# Patient Record
Sex: Female | Born: 1985 | Race: White | Hispanic: No | Marital: Single | Smoking: Current every day smoker
Health system: Southern US, Community
[De-identification: ages and names within clinical notes are randomized; demographics above are authoritative.]

## PROBLEM LIST (undated history)

## (undated) DIAGNOSIS — F4310 Post-traumatic stress disorder, unspecified: Secondary | ICD-10-CM

## (undated) DIAGNOSIS — F419 Anxiety disorder, unspecified: Secondary | ICD-10-CM

## (undated) HISTORY — PX: TONSILLECTOMY: SUR1361

## (undated) HISTORY — PX: BREAST ENHANCEMENT SURGERY: SHX7

## (undated) HISTORY — DX: Anxiety disorder, unspecified: F41.9

## (undated) HISTORY — DX: Post-traumatic stress disorder, unspecified: F43.10

---

## 2004-11-29 ENCOUNTER — Encounter: Payer: Self-pay | Admitting: Emergency Medicine

## 2004-11-29 ENCOUNTER — Observation Stay (HOSPITAL_COMMUNITY): Admission: AD | Admit: 2004-11-29 | Discharge: 2004-11-30 | Payer: Self-pay | Admitting: *Deleted

## 2006-10-09 IMAGING — CT CT ABDOMEN W/O CM
1 of 2 series · 15 of 32 positions shown, 19 images · IV contrast (agent unspecified)
Comparison: none

CLINICAL DATA: Severe lower abdominal and pelvic pain. Polycystic ovarian syndrome.  Hematuria.  Evaluate for urinary calculi or obstruction.
ABDOMEN CT WITHOUT CONTRAST:
TECHNIQUE: Multidetector CT imaging of the abdomen was performed following the standard protocol without IV contrast.  
There is no evidence of renal calculi or hydronephrosis.  Both kidneys are normal in contour and there is no evidence of perinephric fluid or inflammatory changes. 
Noncontrast images of the abdominal parenchymal organs are unremarkable.  The gallbladder is also unremarkable in appearance.  There is no evidence of inflammatory process or abnormal fluid collections.  There are shotty small less than 1 cm mesenteric lymph nodes noted which is a nonspecific finding and could be related to mesenteric adenitis.
TECHNIQUE: Multidetector CT imaging of the pelvis was performed following the standard protocol without IV contrast. 
There is no evidence of ureteral calculi or dilatation.  A Foley catheter is seen within the bladder which is decompressed.  The uterus is normal in size.  There is no evidence of pelvic masses or inflammatory process.  No abnormal fluid collections are seen.  The unopacified bowel loops are unremarkable in appearance.

[Series 2: abd pelvis · axial · 0.77mm/px · z∈[-470,-70]mm · 15 of 88 slices shown, 19 images]
[im 4/88  soft-tissue]
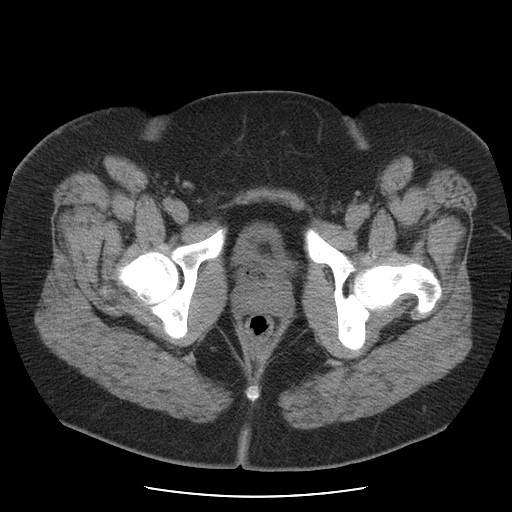
[im 4/88  bone]
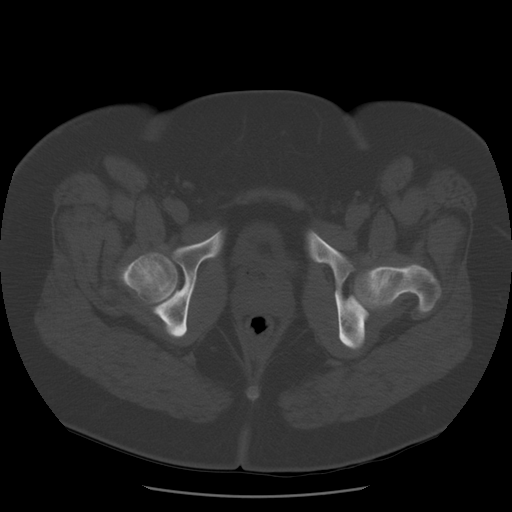
[im 11/88  soft-tissue]
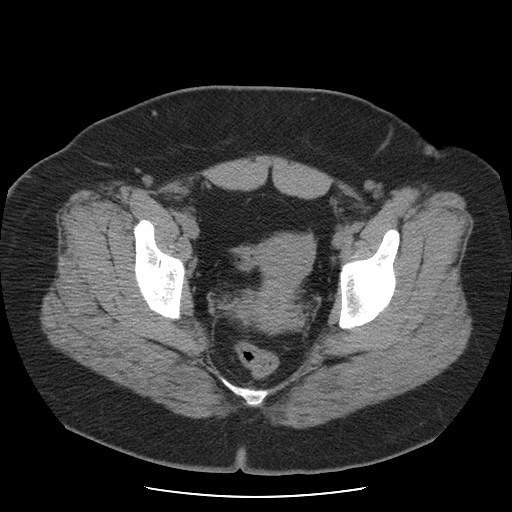
[im 17/88  soft-tissue]
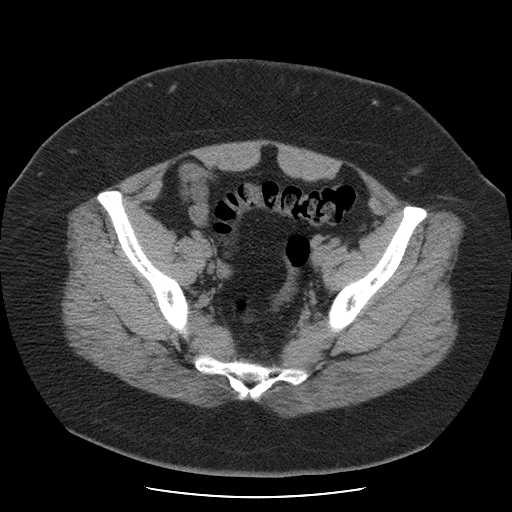
[im 24/88  soft-tissue]
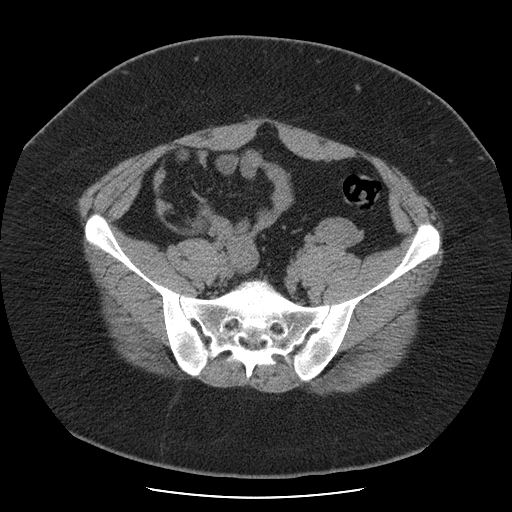
[im 31/88  soft-tissue]
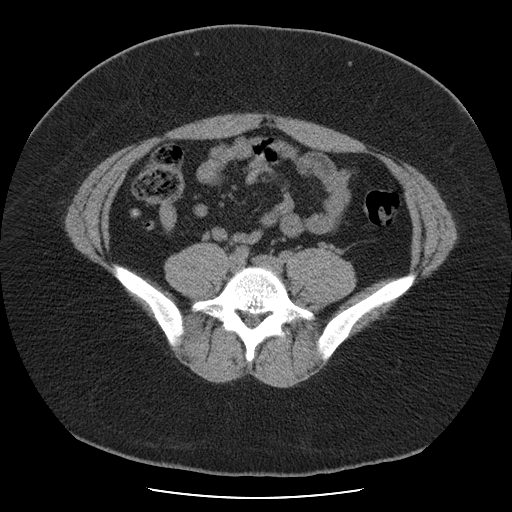
[im 37/88  soft-tissue]
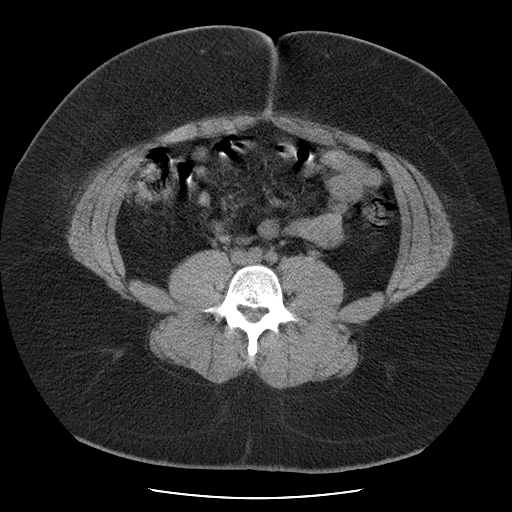
[im 44/88  soft-tissue]
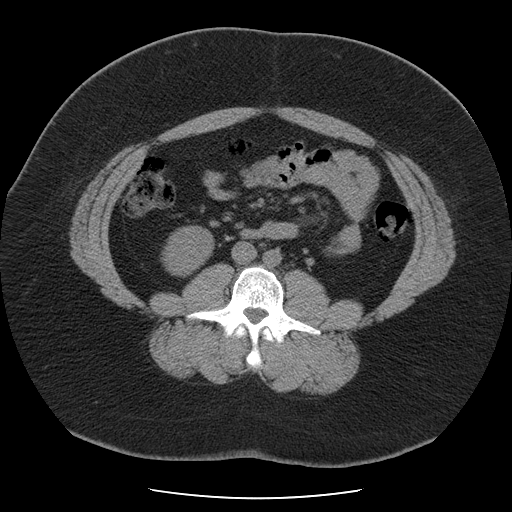
[im 51/88  soft-tissue]
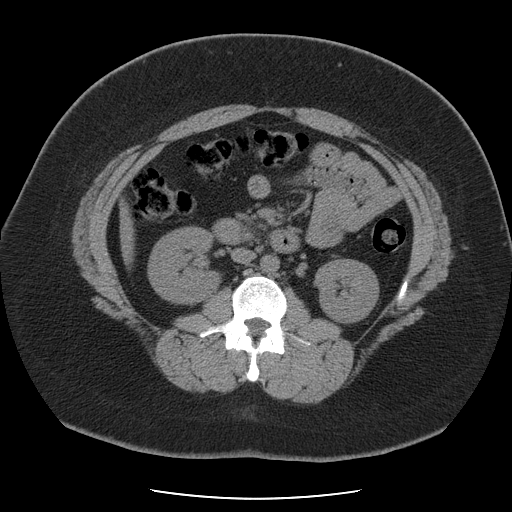
[im 57/88  soft-tissue]
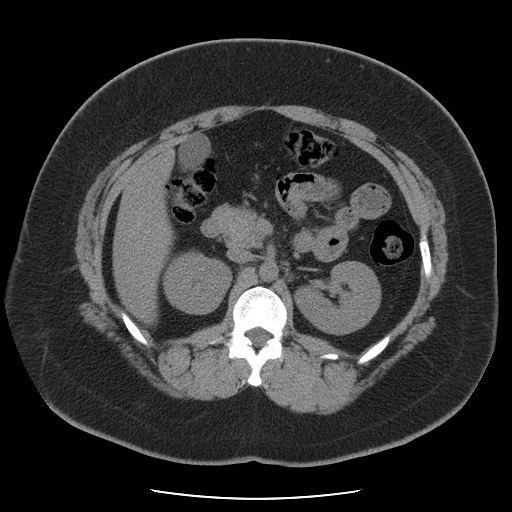
[im 57/88  bone]
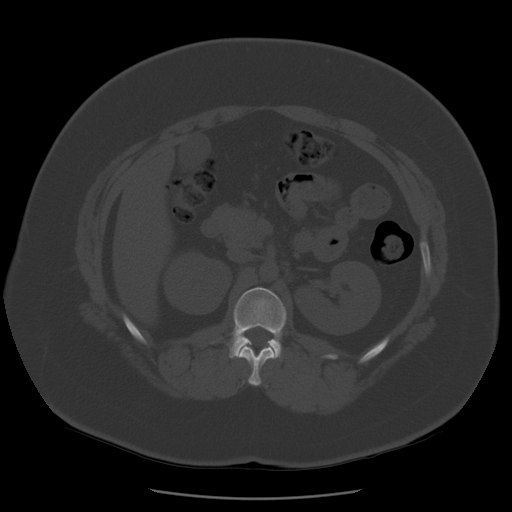
[im 64/88  soft-tissue]
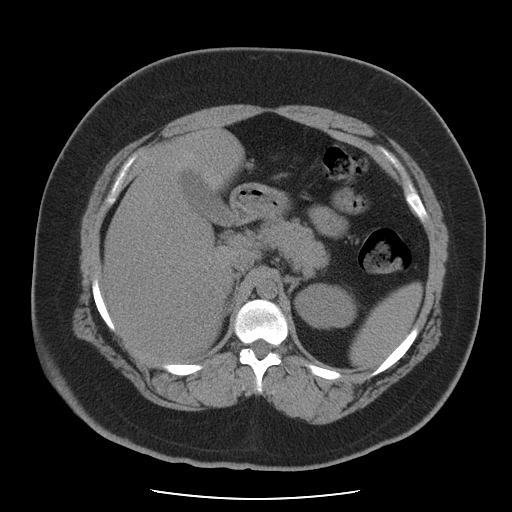
[im 71/88  soft-tissue]
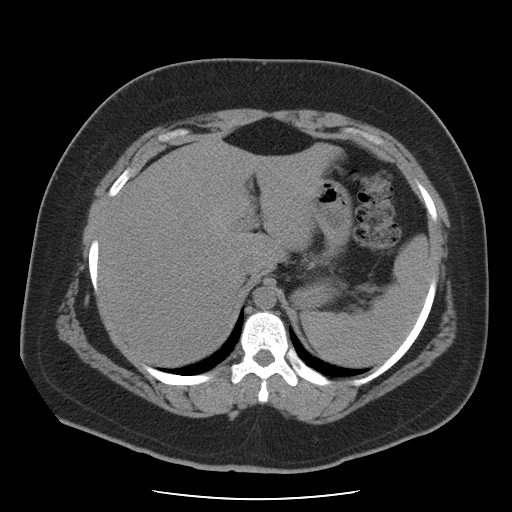
[im 74/88  lung]
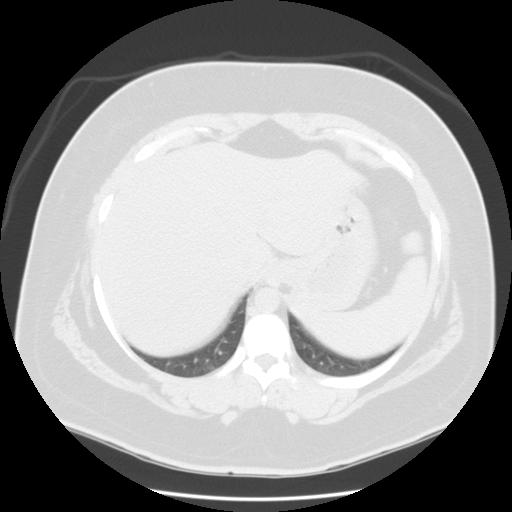
[im 77/88  soft-tissue]
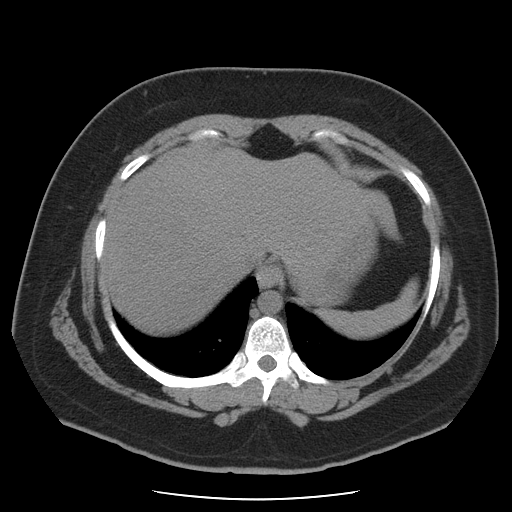
[im 77/88  lung]
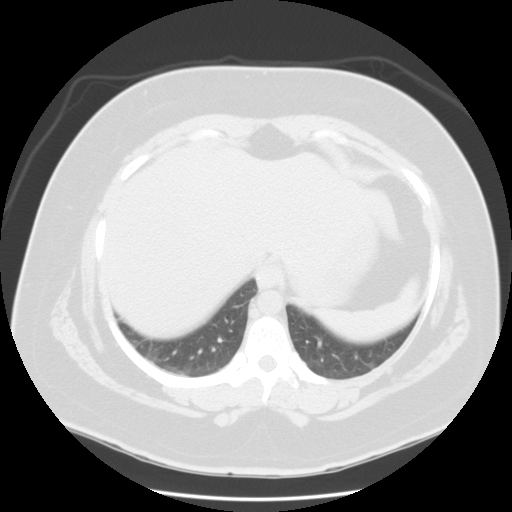
[im 81/88  lung]
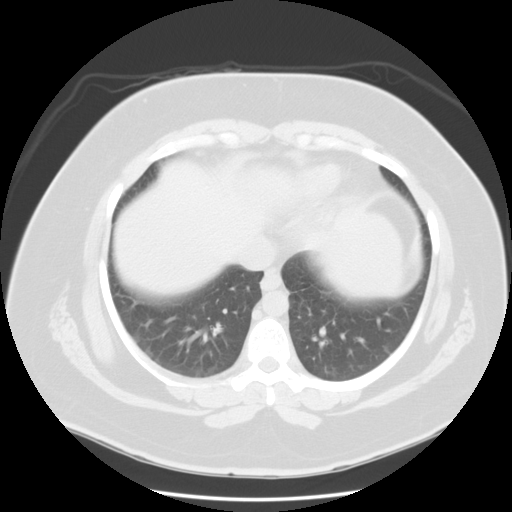
[im 84/88  soft-tissue]
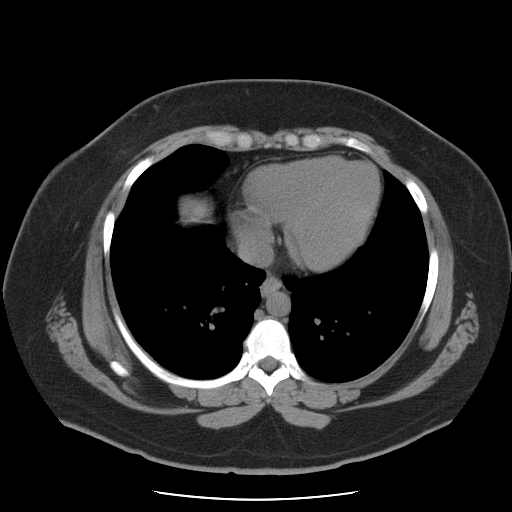
[im 84/88  lung]
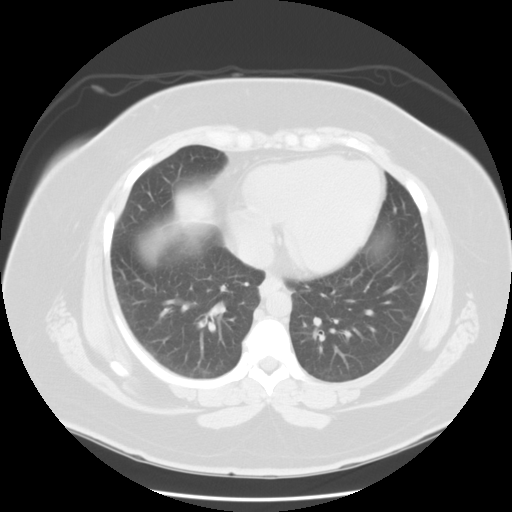

[15 of 32 positions shown; findings below may reference images not displayed]

IMPRESSION: 1.  No evidence of renal calculi or hydronephrosis.
2.  Shotty, small less than 1 cm, mesenteric lymph nodes noted.  This is a nonspecific finding, and may be seen with mesenteric adenitis/gastroenteritis.  No inflammatory process or abnormal fluid collections identified.  
PELVIS CT WITHOUT CONTRAST:
IMPRESSION: No acute findings.  No evidence of ureteral calculi or dilatation.

## 2007-12-29 ENCOUNTER — Ambulatory Visit: Payer: Self-pay | Admitting: Family Medicine

## 2010-03-02 ENCOUNTER — Emergency Department: Payer: Self-pay | Admitting: Emergency Medicine

## 2010-09-30 ENCOUNTER — Emergency Department: Payer: Self-pay | Admitting: Unknown Physician Specialty

## 2010-10-09 ENCOUNTER — Emergency Department: Payer: Self-pay | Admitting: Emergency Medicine

## 2010-10-12 ENCOUNTER — Emergency Department: Payer: Self-pay | Admitting: Emergency Medicine

## 2010-10-13 ENCOUNTER — Emergency Department (HOSPITAL_COMMUNITY)
Admission: EM | Admit: 2010-10-13 | Discharge: 2010-10-13 | Disposition: A | Discharge status: Home / Self Care | Payer: Self-pay | Attending: Emergency Medicine | Admitting: Emergency Medicine

## 2010-10-13 ENCOUNTER — Emergency Department (HOSPITAL_COMMUNITY): Payer: Self-pay

## 2010-10-13 DIAGNOSIS — R05 Cough: Secondary | ICD-10-CM | POA: Insufficient documentation

## 2010-10-13 DIAGNOSIS — N390 Urinary tract infection, site not specified: Secondary | ICD-10-CM | POA: Insufficient documentation

## 2010-10-13 DIAGNOSIS — Z79899 Other long term (current) drug therapy: Secondary | ICD-10-CM | POA: Insufficient documentation

## 2010-10-13 DIAGNOSIS — R509 Fever, unspecified: Secondary | ICD-10-CM | POA: Insufficient documentation

## 2010-10-13 DIAGNOSIS — R059 Cough, unspecified: Secondary | ICD-10-CM | POA: Insufficient documentation

## 2010-10-13 DIAGNOSIS — R112 Nausea with vomiting, unspecified: Secondary | ICD-10-CM | POA: Insufficient documentation

## 2010-10-13 DIAGNOSIS — R197 Diarrhea, unspecified: Secondary | ICD-10-CM | POA: Insufficient documentation

## 2010-10-13 LAB — URINALYSIS, ROUTINE W REFLEX MICROSCOPIC
Hgb urine dipstick: NEGATIVE
Leukocytes, UA: NEGATIVE
Nitrite: NEGATIVE
Protein, ur: NEGATIVE mg/dL
Specific Gravity, Urine: 1.024 (ref 1.005–1.030)
Urobilinogen, UA: 0.2 mg/dL (ref 0.0–1.0)

## 2010-10-13 LAB — CBC
MCV: 88.2 fL (ref 78.0–100.0)
Platelets: 191 10*3/uL (ref 150–400)
RBC: 4.4 MIL/uL (ref 3.87–5.11)
RDW: 13.4 % (ref 11.5–15.5)
WBC: 9.2 10*3/uL (ref 4.0–10.5)

## 2010-10-13 LAB — POCT PREGNANCY, URINE: Preg Test, Ur: NEGATIVE

## 2010-10-13 LAB — BASIC METABOLIC PANEL
Calcium: 8.6 mg/dL (ref 8.4–10.5)
Chloride: 100 mEq/L (ref 96–112)
Creatinine, Ser: 1.01 mg/dL (ref 0.50–1.10)
GFR calc Af Amer: >60 mL/min (ref >60)
Sodium: 135 mEq/L (ref 135–145)

## 2010-10-13 LAB — DIFFERENTIAL
Basophils Absolute: 0.2 10*3/uL — ABNORMAL HIGH (ref 0.0–0.1)
Basophils Relative: 2 % — ABNORMAL HIGH (ref 0–1)
Eosinophils Absolute: 0.1 10*3/uL (ref 0.0–0.7)
Lymphocytes Relative: 51 % — ABNORMAL HIGH (ref 12–46)
Monocytes Absolute: 0.4 10*3/uL (ref 0.1–1.0)
Neutrophils Relative %: 42 % — ABNORMAL LOW (ref 43–77)

## 2010-10-13 LAB — PATHOLOGIST SMEAR REVIEW: Tech Review: REACTIVE

## 2010-10-13 LAB — MONONUCLEOSIS SCREEN: Mono Screen: NEGATIVE

## 2010-10-13 MED ORDER — IOHEXOL 300 MG/ML  SOLN
80.0000 mL | Freq: Once | INTRAMUSCULAR | Status: AC | PRN
  Administered 2010-10-13: 80 mL via INTRAVENOUS

## 2010-10-14 LAB — URINE CULTURE

## 2010-10-19 LAB — CULTURE, BLOOD (ROUTINE X 2): Culture: NO GROWTH

## 2011-04-15 ENCOUNTER — Emergency Department: Payer: Self-pay | Admitting: Emergency Medicine

## 2011-04-15 LAB — CBC
HCT: 41 %
HGB: 14.1 g/dL
MCH: 31.1 pg
MCHC: 34.4 g/dL
MCV: 90 fL
Platelet: 262 10*3/uL
RBC: 4.54 X10 6/mm 3
RDW: 13.1 %
WBC: 7.9 10*3/uL

## 2011-04-15 LAB — COMPREHENSIVE METABOLIC PANEL
Albumin: 4 g/dL (ref 3.4–5.0)
BUN: 18 mg/dL (ref 7–18)
Creatinine: 0.81 mg/dL (ref 0.60–1.30)
SGPT (ALT): 46 U/L
Total Protein: 7.3 g/dL (ref 6.4–8.2)

## 2011-04-16 LAB — URINALYSIS, COMPLETE
Bilirubin,UR: NEGATIVE
Ketone: NEGATIVE
Ph: 5 (ref 4.5–8.0)
Protein: 100
Specific Gravity: 1.025 (ref 1.003–1.030)
Squamous Epithelial: <1
WBC UR: 8 /HPF (ref 0–5)

## 2011-09-09 ENCOUNTER — Emergency Department: Payer: Self-pay | Admitting: Emergency Medicine

## 2012-08-10 IMAGING — CT CT ABD-PELV W/O CM
1 of 2 series · 15 of 32 positions shown, 19 images · non-contrast
Comparison: None

REASON FOR EXAM: (1) flank [pain; (2) flank ppain
COMMENTS:   May transport without cardiac monitor

PROCEDURE:     CT  - CT ABDOMEN AND PELVIS W[DATE]  [DATE]
RESULT:     Indication: flank pain
TECHNIQUE: Multiple axial images from the lung bases to the symphysis pubis
were obtained without oral and without intravenous contrast.

[Series 4: soft tissue · axial · 0.78mm/px · z∈[-1190,-734]mm · 15 of 166 slices shown, 19 images]
[im 7/166  soft-tissue]
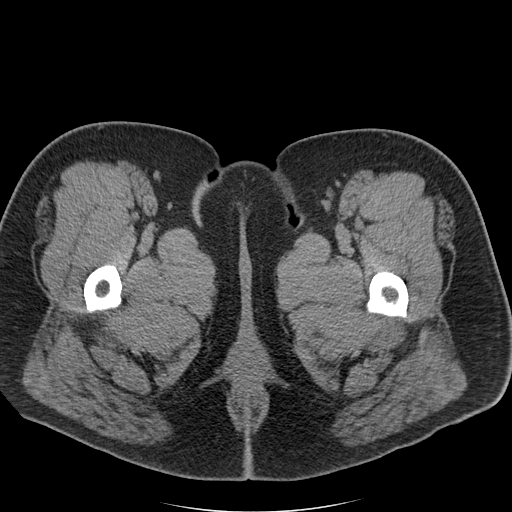
[im 7/166  bone]
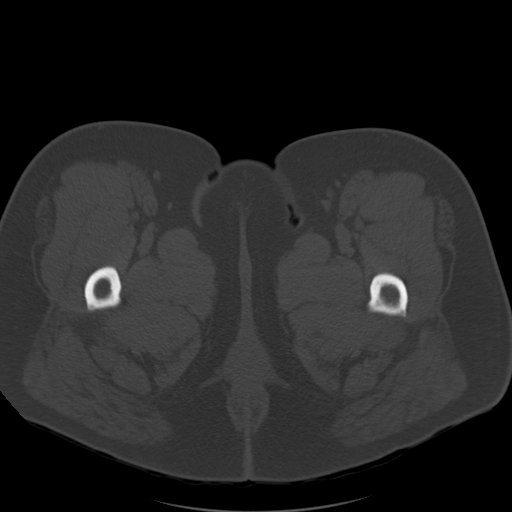
[im 20/166  soft-tissue]
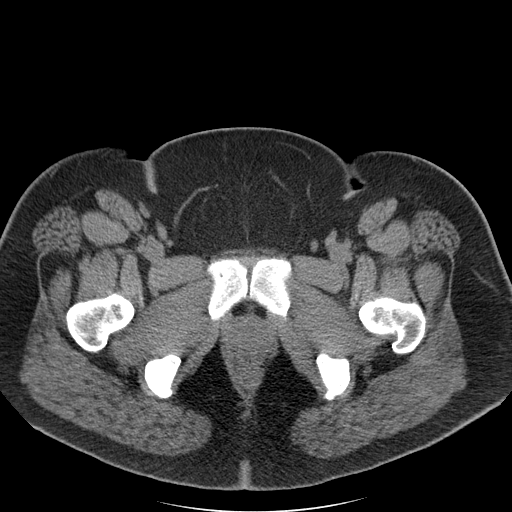
[im 32/166  soft-tissue]
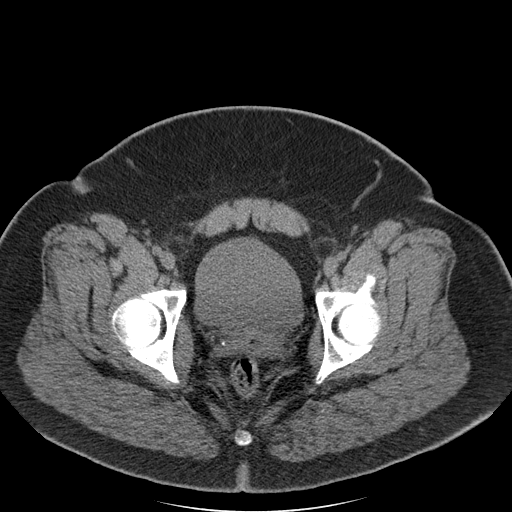
[im 45/166  soft-tissue]
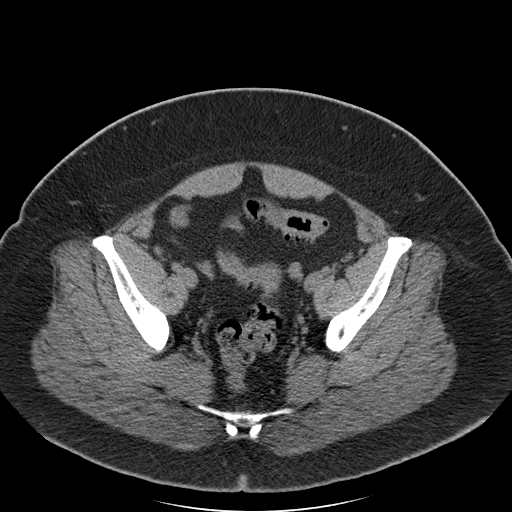
[im 58/166  soft-tissue]
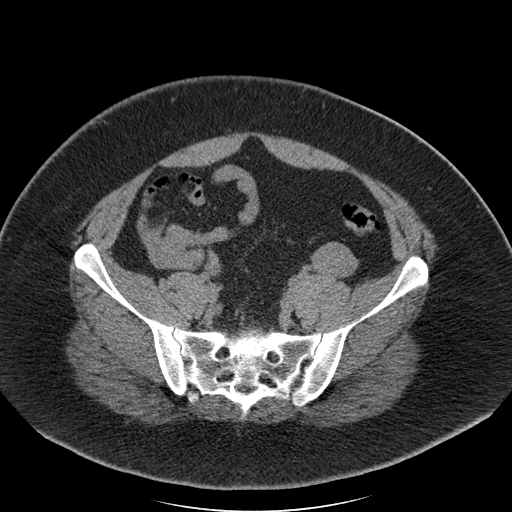
[im 70/166  soft-tissue]
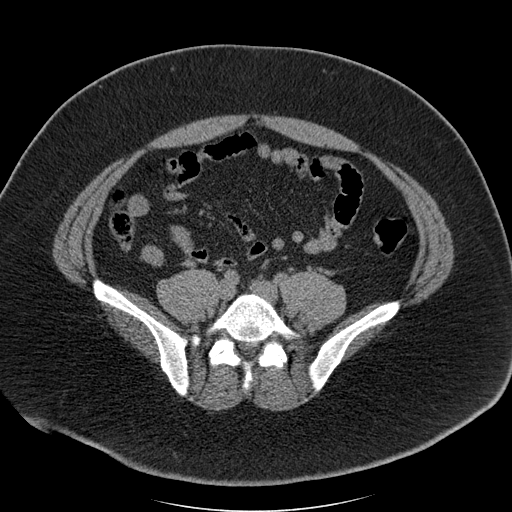
[im 83/166  soft-tissue]
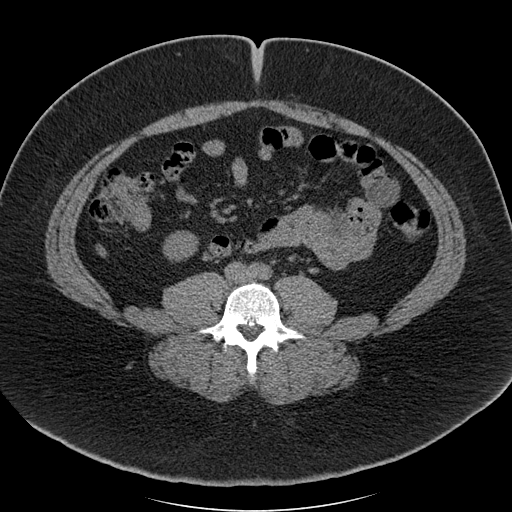
[im 96/166  soft-tissue]
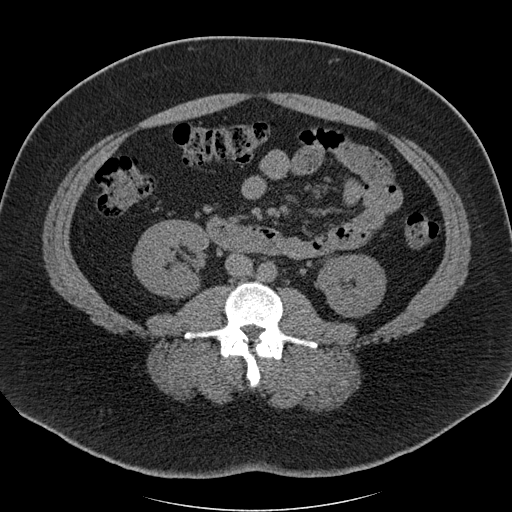
[im 108/166  soft-tissue]
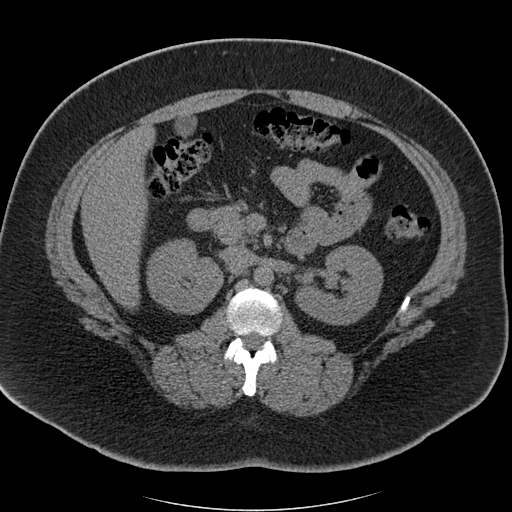
[im 108/166  bone]
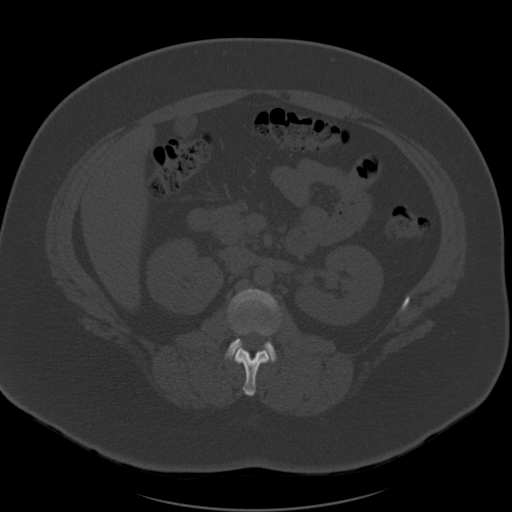
[im 121/166  soft-tissue]
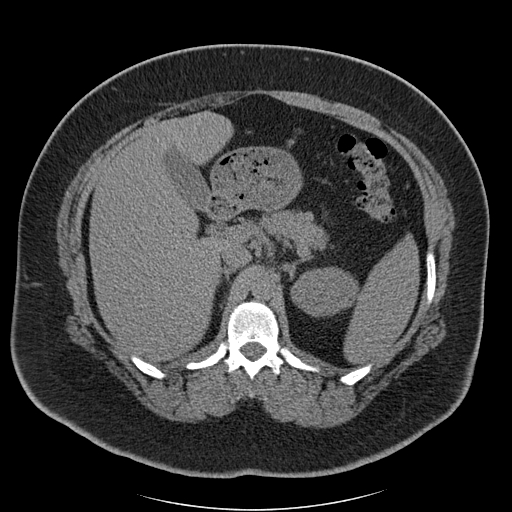
[im 134/166  soft-tissue]
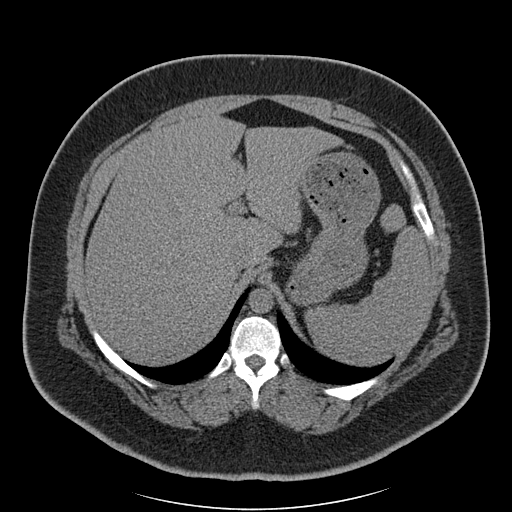
[im 140/166  lung]
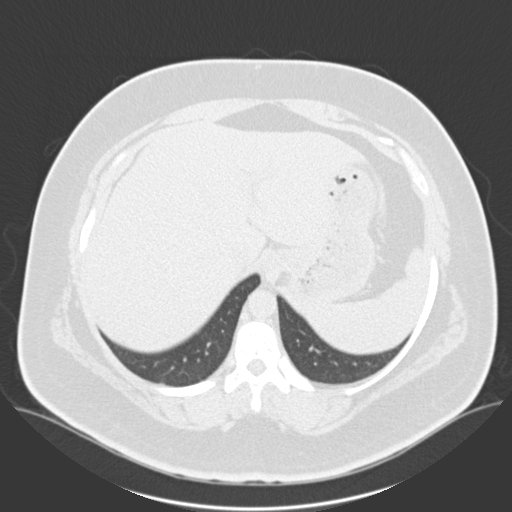
[im 146/166  soft-tissue]
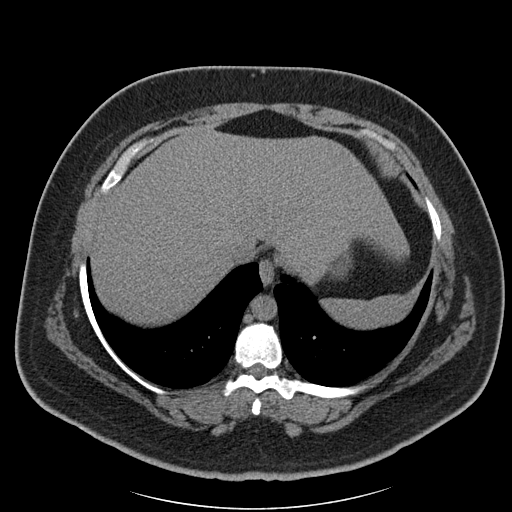
[im 146/166  lung]
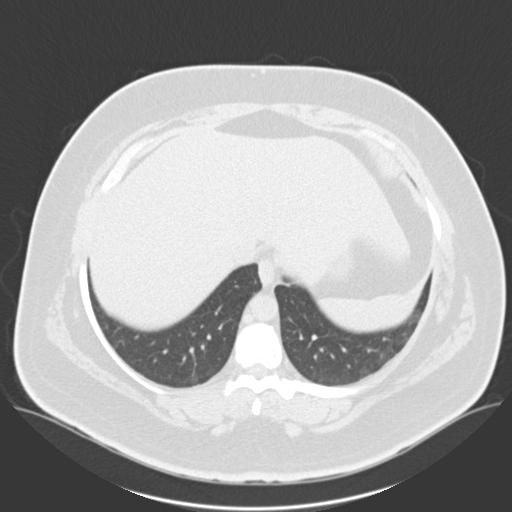
[im 153/166  lung]
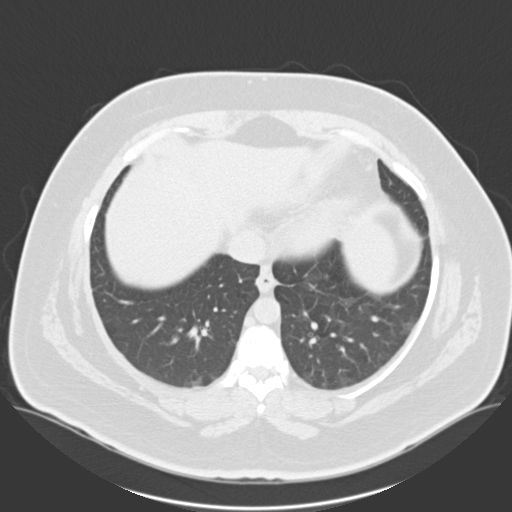
[im 159/166  soft-tissue]
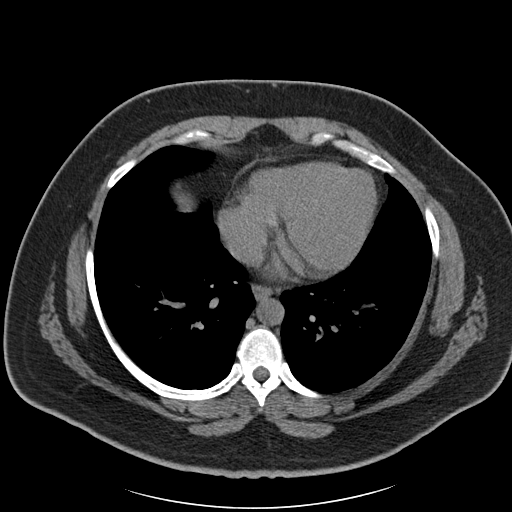
[im 159/166  lung]
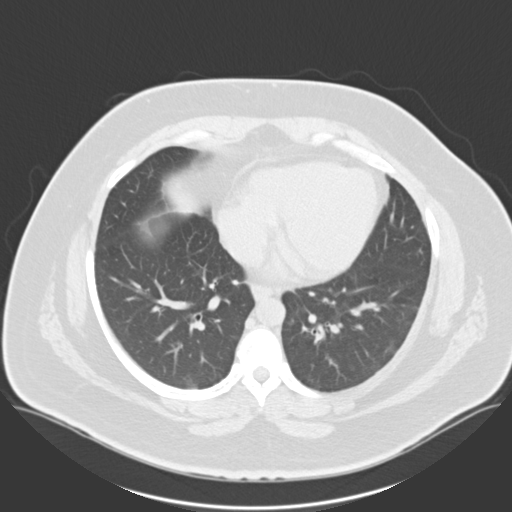

[15 of 32 positions shown; findings below may reference images not displayed]

FINDINGS: The lung bases are clear. There is no pleural or pericardial effusions.

No renal, ureteral, or bladder calculi. No obstructive uropathy. No
perinephric stranding is seen. The kidneys are symmetric in size without
evidence for exophytic mass. The bladder is unremarkable.

The liver demonstrates no focal abnormality. The gallbladder is
unremarkable. The spleen demonstrates no focal abnormality. The adrenal
glands and pancreas are normal.

The unopacified stomach, duodenum, small intestine, and large intestine are
unremarkable, but evaluation is limited by lack of oral contrast. There is a
normal caliber appendix in the right lower quadrant without periappendiceal
inflammatory changes. There is no pneumoperitoneum, pneumatosis, or portal
venous gas. There is no abdominal or pelvic free fluid. There is no
lymphadenopathy.

The abdominal aorta is normal in caliber .

The osseous structures are unremarkable.
IMPRESSION: 1. No urolithiasis or obstructive uropathy.

## 2012-08-18 IMAGING — US US PELV - US TRANSVAGINAL
1 series · 17 of 25 positions shown · non-contrast
Comparison: none

REASON FOR EXAM: pelvic pain and fever
COMMENTS:

PROCEDURE:     US  - US PELVIS EXAM W/TRANSVAGINAL  - October 09, 2010  [DATE]
RESULT:     Comparison: None.
TECHNIQUE: Multiple grayscale and color Doppler images obtained of the
pelvis by transabdominal ultrasound and endovaginal ultrasound.

[Series 1: us pelv - us transvaginal · 17 of 44 slices shown]
[im 1/44]
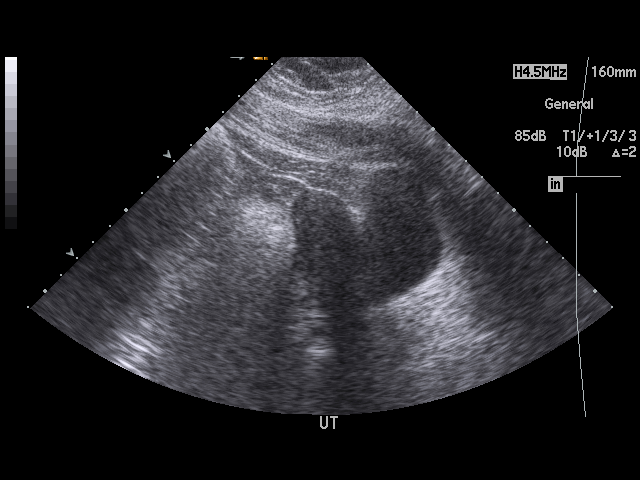
[im 4/44]
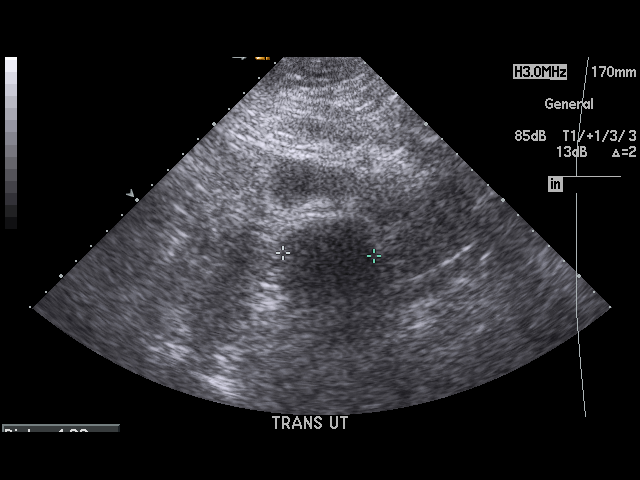
[im 6/44]
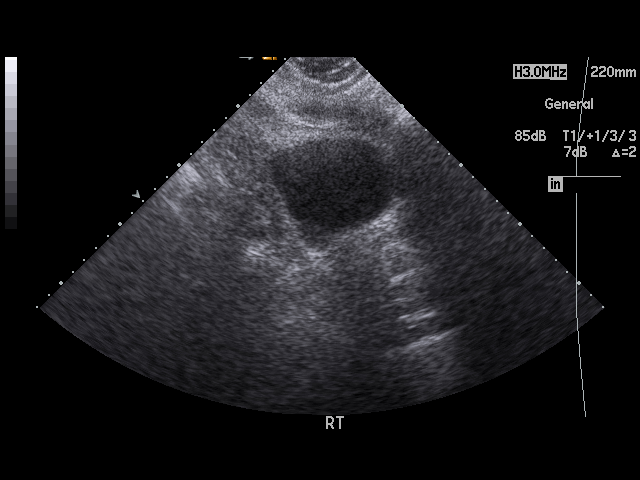
[im 9/44]
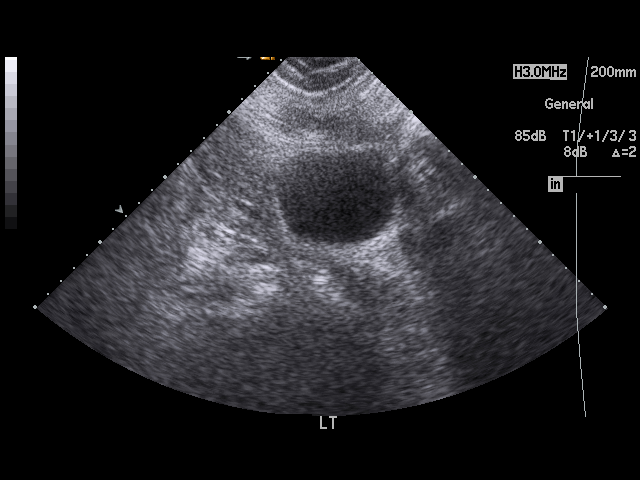
[im 11/44]
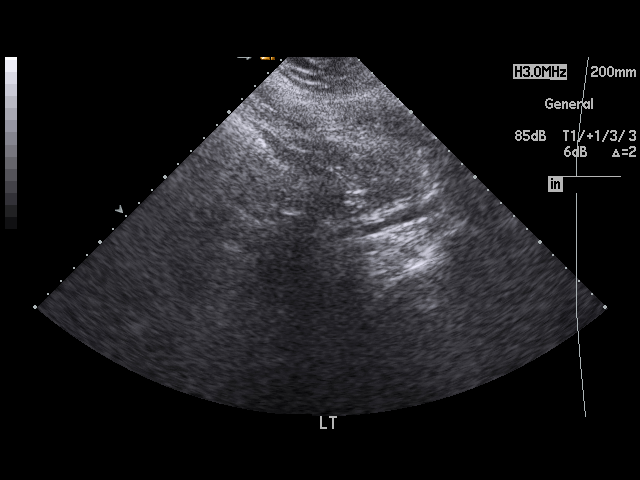
[im 15/44]
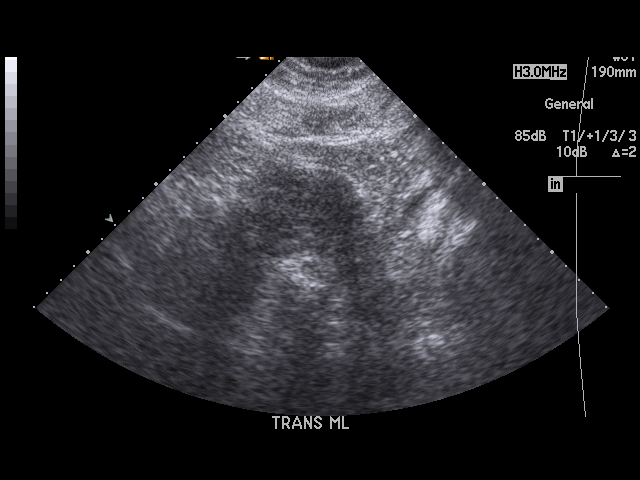
[im 17/44]
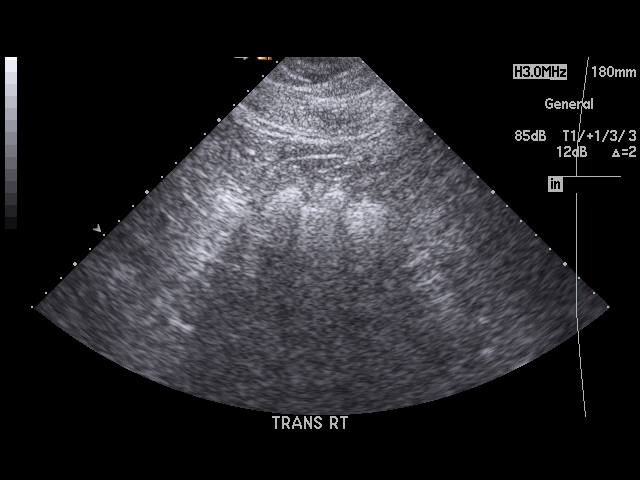
[im 20/44]
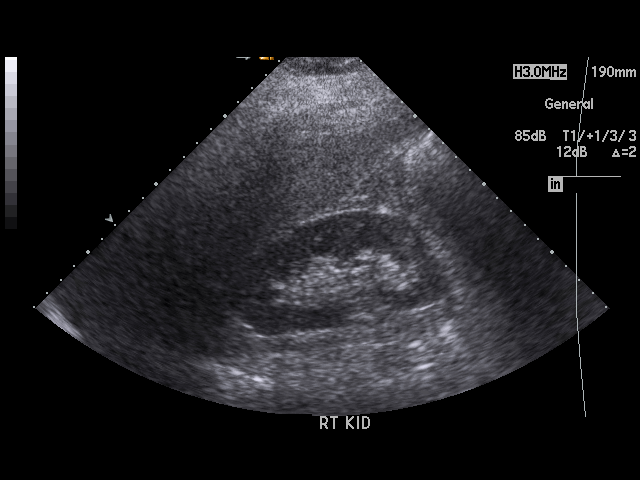
[im 22/44]
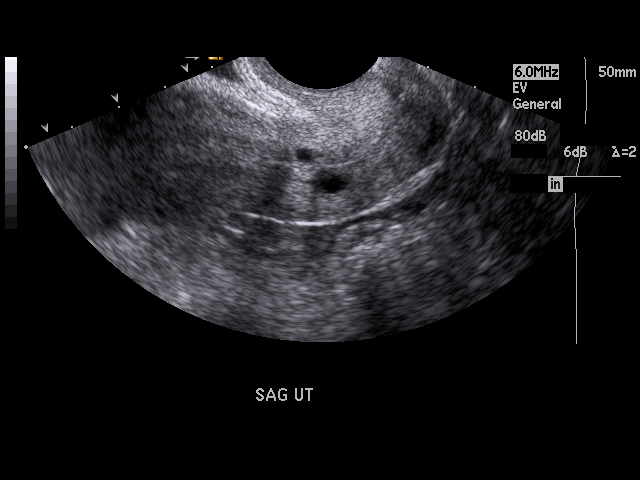
[im 24/44]
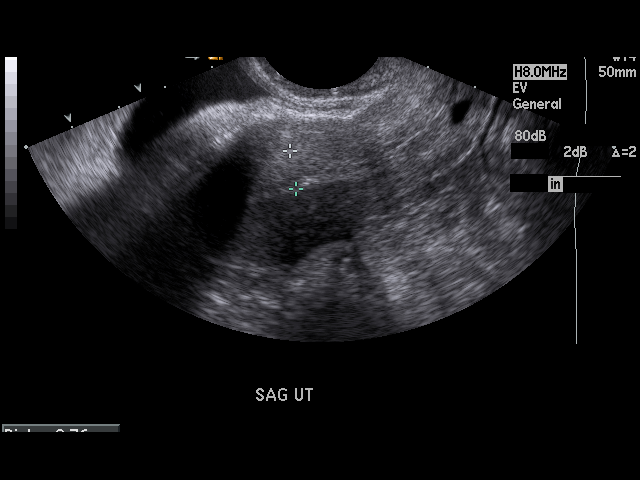
[im 27/44]
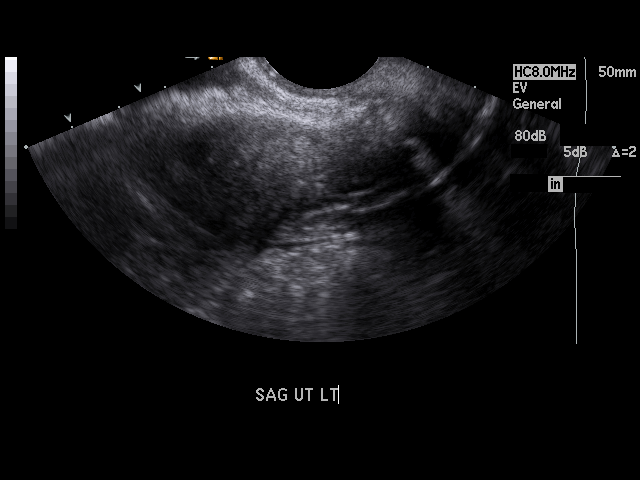
[im 29/44]
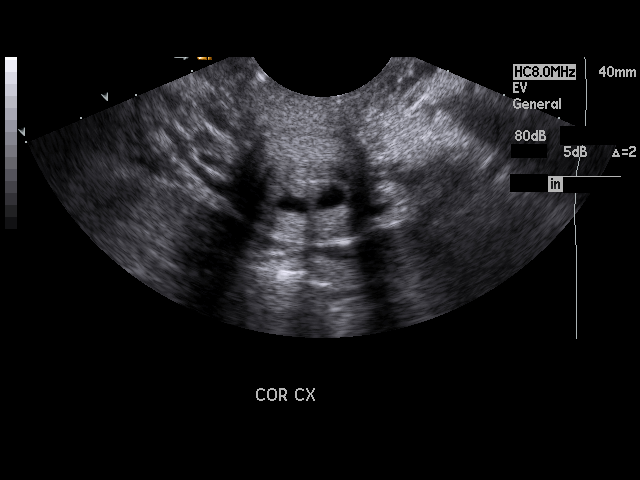
[im 33/44]
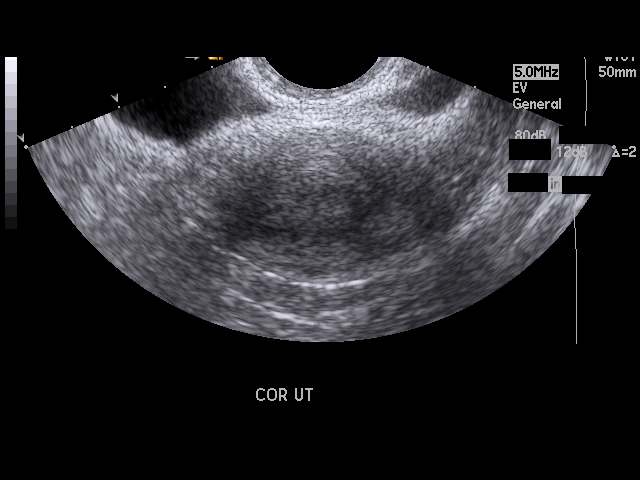
[im 35/44]
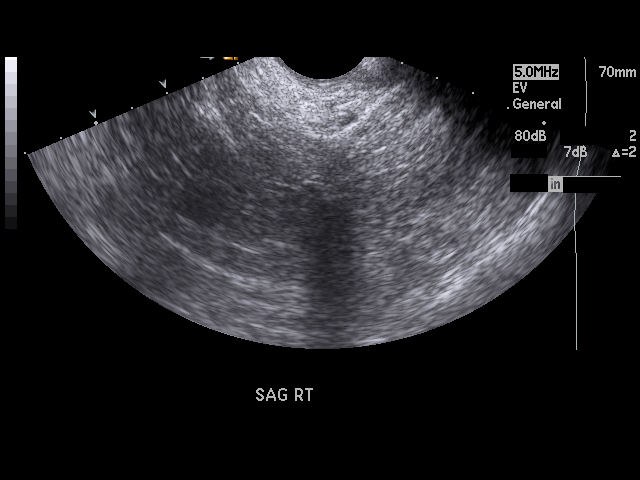
[im 38/44]
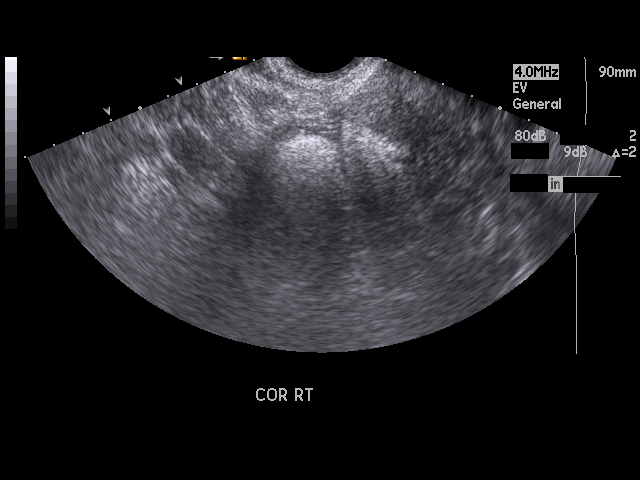
[im 40/44]
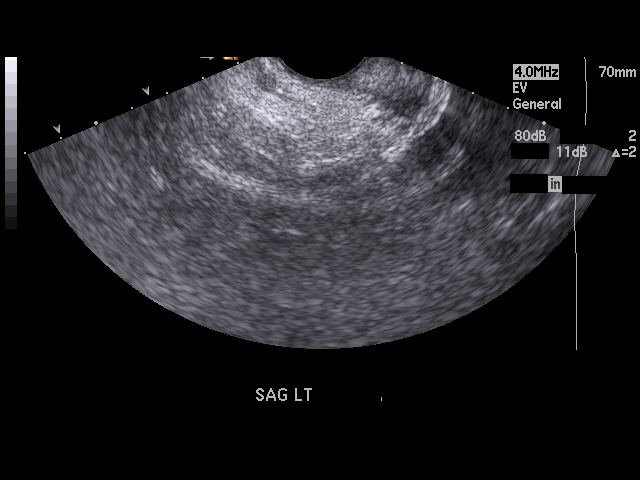
[im 44/44]
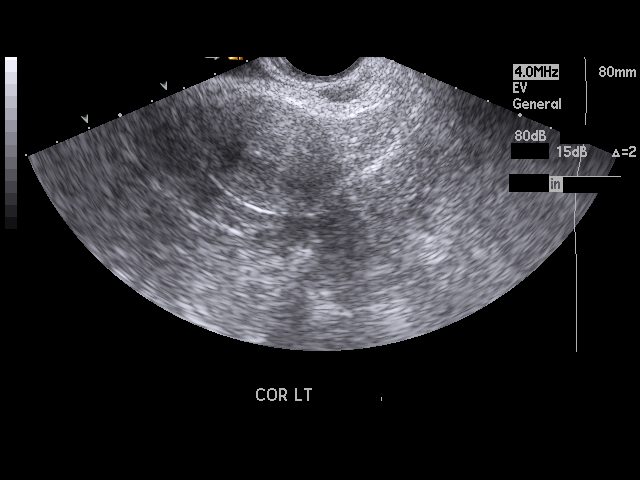

[17 of 25 positions shown; findings below may reference images not displayed]

FINDINGS: The uterus measures 8.2 x 4.2 x 2.8 cm. The endometrial stripe measures 8
mm. The ovaries were unable to be visualized. No adnexal mass identified.
Small nabothian cysts in the cervix.
IMPRESSION: 1. Unremarkable uterus.
2. Ovaries were unable to be visualized.

## 2013-06-25 ENCOUNTER — Emergency Department: Payer: Self-pay | Admitting: Emergency Medicine

## 2013-12-01 ENCOUNTER — Ambulatory Visit: Payer: Self-pay | Admitting: Family Medicine

## 2013-12-08 ENCOUNTER — Ambulatory Visit: Payer: Self-pay | Admitting: Family Medicine

## 2013-12-15 ENCOUNTER — Ambulatory Visit: Payer: Self-pay | Admitting: Family Medicine

## 2013-12-24 ENCOUNTER — Ambulatory Visit: Payer: Self-pay | Admitting: Emergency Medicine

## 2014-01-12 ENCOUNTER — Emergency Department: Payer: Self-pay | Admitting: Emergency Medicine

## 2015-05-02 ENCOUNTER — Encounter: Payer: Self-pay | Admitting: Family Medicine

## 2015-05-02 ENCOUNTER — Ambulatory Visit (INDEPENDENT_AMBULATORY_CARE_PROVIDER_SITE_OTHER): Payer: BLUE CROSS/BLUE SHIELD | Admitting: Family Medicine

## 2015-05-02 VITALS — BP 120/80 | HR 80 | Temp 97.8°F | Ht 63.0 in | Wt 257.0 lb

## 2015-05-02 DIAGNOSIS — J40 Bronchitis, not specified as acute or chronic: Secondary | ICD-10-CM

## 2015-05-02 DIAGNOSIS — J0100 Acute maxillary sinusitis, unspecified: Secondary | ICD-10-CM

## 2015-05-02 LAB — POCT INFLUENZA A/B
INFLUENZA B, POC: NEGATIVE
Influenza A, POC: NEGATIVE

## 2015-05-02 MED ORDER — DOXYCYCLINE HYCLATE 100 MG PO TABS: 100.0000 mg | ORAL_TABLET | Freq: Two times a day (BID) | ORAL | Status: DC

## 2015-05-02 MED ORDER — GUAIFENESIN-CODEINE 100-10 MG/5ML PO SYRP: 5.0000 mL | ORAL_SOLUTION | Freq: Three times a day (TID) | ORAL | Status: DC | PRN

## 2015-05-02 NOTE — Progress Notes (Signed)
Name: Angela Lester   MRN: [id number]    DOB: 1985-07-31   Date:05/02/2015       Progress Note  Subjective  Chief Complaint  Chief Complaint  Patient presents with  . Establish Care  . Sinusitis    cough and cong x 3 weeks- tried Theraflu, Sudafed and Flonase    Sinusitis This is a new problem. The current episode started 1 to 4 weeks ago. The problem has been gradually worsening since onset. The maximum temperature recorded prior to her arrival was 100.4 - 100.9 F. Associated symptoms include chills, congestion, coughing, diaphoresis, ear pain, headaches, a hoarse voice, neck pain, shortness of breath, sinus pressure, sneezing, a sore throat and swollen glands. Past treatments include acetaminophen and oral decongestants. The treatment provided no relief.  Cough This is a new problem. The current episode started 1 to 4 weeks ago. The problem has been gradually worsening. The cough is productive of purulent sputum (yellow). Associated symptoms include chills, ear pain, headaches, a sore throat and shortness of breath. Pertinent negatives include no chest pain, fever, heartburn, myalgias, rash, weight loss or wheezing. The symptoms are aggravated by cold air. She has tried OTC cough suppressant for the symptoms. The treatment provided no relief. There is no history of environmental allergies.    No problem-specific assessment & plan notes found for this encounter.   Past Medical History  Diagnosis Date  . Anxiety   . Post traumatic stress disorder (PTSD)     Past Surgical History  Procedure Laterality Date  . Breast enhancement surgery    . Tonsillectomy      Family History  Problem Relation Age of Onset  . Cancer Mother   . Diabetes Father   . Hypertension Father   . Cancer Maternal Grandfather   . Cancer Paternal Grandmother     Social History   Social History  . Marital Status: Single    Spouse Name: N/A  . Number of Children: N/A  . Years of Education: N/A    Occupational History  . Not on file.   Social History Main Topics  . Smoking status: Former Games developer  . Smokeless tobacco: Not on file  . Alcohol Use: No  . Drug Use: No  . Sexual Activity: No   Other Topics Concern  . Not on file   Social History Narrative  . No narrative on file    Allergies  Allergen Reactions  . Amoxil [Amoxicillin]   . Codeine   . Vicodin [Hydrocodone-Acetaminophen]      Review of Systems  Constitutional: Positive for chills and diaphoresis. Negative for fever, weight loss and malaise/fatigue.  HENT: Positive for congestion, ear pain, hoarse voice, sinus pressure, sneezing and sore throat. Negative for ear discharge.   Eyes: Positive for blurred vision.  Respiratory: Positive for cough and shortness of breath. Negative for sputum production and wheezing.   Cardiovascular: Negative for chest pain, palpitations and leg swelling.  Gastrointestinal: Negative for heartburn, nausea, abdominal pain, diarrhea, constipation, blood in stool and melena.  Genitourinary: Negative for dysuria, urgency, frequency and hematuria.  Musculoskeletal: Positive for neck pain. Negative for myalgias, back pain and joint pain.  Skin: Negative for rash.  Neurological: Positive for headaches. Negative for dizziness, tingling, sensory change and focal weakness.  Endo/Heme/Allergies: Positive for polydipsia. Negative for environmental allergies. Does not bruise/bleed easily.  Psychiatric/Behavioral: Negative for depression and suicidal ideas. The patient is not nervous/anxious and does not have insomnia.      Objective  Filed Vitals:   05/02/15 1503  BP: 120/80  Pulse: 80  Temp: 97.8 F (36.6 C)  TempSrc: Oral  Height:  (1.6 m)  Weight: 257 lb (116.574 kg)    Physical Exam  Constitutional: She is well-developed, well-nourished, and in no distress. No distress.  HENT:  Head: Normocephalic and atraumatic.  Right Ear: External ear normal.  Left Ear: External  ear normal.  Nose: Nose normal.  Mouth/Throat: Oropharynx is clear and moist.  Eyes: Conjunctivae and EOM are normal. Pupils are equal, round, and reactive to light. Right eye exhibits no discharge. Left eye exhibits no discharge.  Neck: Normal range of motion. Neck supple. No JVD present. No thyromegaly present.  Cardiovascular: Normal rate, regular rhythm, normal heart sounds and intact distal pulses.  Exam reveals no gallop and no friction rub.   No murmur heard. Pulmonary/Chest: Effort normal and breath sounds normal.  Abdominal: Soft. Bowel sounds are normal. She exhibits no mass. There is no tenderness. There is no guarding.  Musculoskeletal: Normal range of motion. She exhibits no edema.  Lymphadenopathy:    She has no cervical adenopathy.  Neurological: She is alert.  Skin: Skin is warm and dry. She is not diaphoretic.  Psychiatric: Mood and affect normal.  Nursing note and vitals reviewed.     Assessment & Plan  Problem List Items Addressed This Visit    None    Visit Diagnoses    Acute maxillary sinusitis, recurrence not specified    -  Primary    Relevant Medications    loratadine (CLARITIN) 10 MG tablet    doxycycline (VIBRA-TABS) 100 MG tablet    guaiFENesin-codeine (ROBITUSSIN AC) 100-10 MG/5ML syrup    Bronchitis        pt says she can tolerate if takes benedryl prior    Relevant Medications    doxycycline (VIBRA-TABS) 100 MG tablet    guaiFENesin-codeine (ROBITUSSIN AC) 100-10 MG/5ML syrup    Other Relevant Orders    POCT Influenza A/B (Completed)         Dr. Hay[facility]  05/02/2015

## 2016-11-13 ENCOUNTER — Ambulatory Visit
Admission: EM | Admit: 2016-11-13 | Discharge: 2016-11-13 | Disposition: A | Discharge status: Home / Self Care | Payer: Self-pay | Attending: Family Medicine | Admitting: Family Medicine

## 2016-11-13 DIAGNOSIS — J069 Acute upper respiratory infection, unspecified: Secondary | ICD-10-CM

## 2016-11-13 MED ORDER — HYDROCOD POLST-CPM POLST ER 10-8 MG/5ML PO SUER: 5.0000 mL | Freq: Two times a day (BID) | ORAL | 0 refills | Status: DC

## 2016-11-13 NOTE — ED Provider Notes (Signed)
[facility]    CSN: [id number] Arrival date & time: 11/13/16  1840     History   Chief Complaint Chief Complaint  Patient presents with  . URI    HPI Angela Lester is a 31 y.o. female.   HPI  This a 31 year old female for the last 2 days has had cough productive of green to dark yellow sputum worsened by lying down otalgia rhinorrhea and headaches. Dates that she has a taste of infection from sinus in her mouth. He has some chest soreness from coughing so much. She has had sick contacts around her her husband who was diagnosed with bronchitis and her mother who accompanies her today and is also a patient. He has been afebrile.        Past Medical History:  Diagnosis Date  . Anxiety   . Post traumatic stress disorder (PTSD)     There are no active problems to display for this patient.   Past Surgical History:  Procedure Laterality Date  . BREAST ENHANCEMENT SURGERY    . TONSILLECTOMY      OB History    No data available       Home Medications    Prior to Admission medications   Medication Sig Start Date End Date Taking? Authorizing Provider  fexofenadine-pseudoephedrine (ALLEGRA-D 24) 180-240 MG 24 hr tablet Take 1 tablet by mouth daily.   Yes [provider]  Multiple Vitamins-Minerals (AIRBORNE PO) Take by mouth.   Yes [provider]  chlorpheniramine-HYDROcodone (TUSSIONEX PENNKINETIC ER) 10-8 MG/5ML SUER Take 5 mLs by mouth 2 (two) times daily. 11/13/16   Lutricia Feil, PA-C  doxycycline (VIBRA-TABS) 100 MG tablet Take 1 tablet (100 mg total) by mouth 2 (two) times daily. 05/02/15   Duanne Limerick, MD  guaiFENesin-codeine (ROBITUSSIN AC) 100-10 MG/5ML syrup Take 5 mLs by mouth 3 (three) times daily as needed for cough. 05/02/15   Duanne Limerick, MD  loratadine (CLARITIN) 10 MG tablet Take 10 mg by mouth daily.    [provider]    Family History Family History  Problem Relation Age of Onset  . Cancer  Mother   . Diabetes Father   . Hypertension Father   . Cancer Maternal Grandfather   . Cancer Paternal Grandmother     Social History Social History  Substance Use Topics  . Smoking status: Current Every Day Smoker    Packs/day: 0.50  . Smokeless tobacco: Never Used  . Alcohol use No     Allergies   Amoxil [amoxicillin]; Codeine; and Vicodin [hydrocodone-acetaminophen]   Review of Systems Review of Systems  Constitutional: Positive for activity change. Negative for chills, fatigue and fever.  HENT: Positive for congestion, postnasal drip, rhinorrhea, sinus pain and sinus pressure.   Respiratory: Positive for cough and shortness of breath.   All other systems reviewed and are negative.    Physical Exam Triage Vital Signs ED Triage Vitals [11/13/16 2010]  Enc Vitals Group     BP (!) 121/94     Pulse Rate 73     Resp 16     Temp 98.2 F (36.8 C)     Temp Source Oral     SpO2 98 %     Weight 225 lb (102.1 kg)     Height 5' 3.5" (1.613 m)     Head Circumference      Peak Flow      Pain Score 5     Pain Loc  Pain Edu?      Excl. in GC?    No data found.   Updated Vital Signs BP (!) 121/94 (BP Location: Left Arm)   Pulse 73   Temp 98.2 F (36.8 C) (Oral)   Resp 16   Ht 5' 3.5" (1.613 m)   Wt 225 lb (102.1 kg)   LMP 10/29/2016   SpO2 98%   BMI 39.23 kg/m   Visual Acuity Right Eye Distance:   Left Eye Distance:   Bilateral Distance:    Right Eye Near:   Left Eye Near:    Bilateral Near:     Physical Exam  Constitutional: She is oriented to person, place, and time. She appears well-developed and well-nourished. No distress.  HENT:  Head: Normocephalic.  Right Ear: External ear normal.  Left Ear: External ear normal.  Nose: Nose normal.  Mouth/Throat: Oropharynx is clear and moist. No oropharyngeal exudate.  Eyes: Pupils are equal, round, and reactive to light. Right eye exhibits no discharge. Left eye exhibits no discharge.  Neck: Normal  range of motion.  Pulmonary/Chest: Effort normal and breath sounds normal. She has no wheezes. She has no rales.  Musculoskeletal: Normal range of motion.  Lymphadenopathy:    She has no cervical adenopathy.  Neurological: She is alert and oriented to person, place, and time.  Skin: Skin is warm and dry. She is not diaphoretic.  Psychiatric: She has a normal mood and affect. Her behavior is normal. Judgment and thought content normal.  Nursing note and vitals reviewed.    UC Treatments / Results  Labs (all labs ordered are listed, but only abnormal results are displayed) Labs Reviewed - No data to display  EKG  EKG Interpretation None       Radiology No results found.  Procedures Procedures (including critical care time)  Medications Ordered in UC Medications - No data to display   Initial Impression / Assessment and Plan / UC Course  I have reviewed the triage vital signs and the nursing notes.  Pertinent labs & imaging results that were available during my care of the patient were reviewed by me and considered in my medical decision making (see chart for details).     Plan: 1. Test/x-ray results and diagnosis reviewed with patient 2. rx as per orders; risks, benefits, potential side effects reviewed with patient 3. Recommend supportive treatment with rest and hydration. Recommend use of Flonase on a daily basis for a month. I have advised her this is likely a viral illness does not require antibiotics at this time. She states that the [employer] do not work for her. I will give her Tussionex for her cough. She has an allergy to the Tussionex  but she states that she's taken in the past and uses Benadryl to help with the rash. I've encouraged her to stop smoking. She's not improving should follow-up with her primary care physician 4. F/u prn if symptoms worsen or don't improve   Final Clinical Impressions(s) / UC Diagnoses   Final diagnoses:  Upper respiratory  tract infection, unspecified type    New Prescriptions Discharge Medication List as of 11/13/2016  8:57 PM    START taking these medications   Details  chlorpheniramine-HYDROcodone (TUSSIONEX PENNKINETIC ER) 10-8 MG/5ML SUER Take 5 mLs by mouth 2 (two) times daily., Starting Tue 11/13/2016, Print         Controlled Substance Prescriptions [place] Controlled Substance Registry consulted? Not Applicable   Lutricia FeilRoemer, Lori Popowski P, PA-C 11/13/16 2059

## 2016-11-13 NOTE — ED Triage Notes (Signed)
X 2 days. She is c/o cough (green-dark yellow sputum and is exacerbated by lying down). She is also c/o otalgia, rhinorrhea, H/A, "sinus infection taste in my mouth" and chest soreness.

## 2017-01-15 ENCOUNTER — Other Ambulatory Visit: Payer: Self-pay

## 2017-01-15 ENCOUNTER — Ambulatory Visit
Admission: EM | Admit: 2017-01-15 | Discharge: 2017-01-15 | Disposition: A | Discharge status: Home / Self Care | Payer: Self-pay | Attending: Family Medicine | Admitting: Family Medicine

## 2017-01-15 DIAGNOSIS — J069 Acute upper respiratory infection, unspecified: Secondary | ICD-10-CM

## 2017-01-15 MED ORDER — HYDROCOD POLST-CPM POLST ER 10-8 MG/5ML PO SUER: 5.0000 mL | Freq: Two times a day (BID) | ORAL | 0 refills | Status: DC

## 2017-01-15 NOTE — ED Provider Notes (Signed)
[facility]    CSN: [id number] Arrival date & time: 01/15/17  [id number]     History   Chief Complaint Chief Complaint  Patient presents with  . Generalized Body Aches    HPI Mamye Sherley BoundsLynn Baxter is a 31 y.o. female.   HPI  31 year old female who presents with  aches chills nonproductive cough that started approximately 2 days ago. She states yesterday she awoke with her symptoms much worse. She is coughing excessively at nighttime and is keeping her awake. She has had no shortness of breath. She continues to smoke half a pack a day.        Past Medical History:  Diagnosis Date  . Anxiety   . Post traumatic stress disorder (PTSD)     There are no active problems to display for this patient.   Past Surgical History:  Procedure Laterality Date  . BREAST ENHANCEMENT SURGERY    . TONSILLECTOMY      OB History    No data available       Home Medications    Prior to Admission medications   Medication Sig Start Date End Date Taking? Authorizing Provider  chlorpheniramine-HYDROcodone Stevphen Meuse([facility]) 10-8 MG/5ML SUER Take 5 mLs 2 (two) times daily by mouth. 01/15/17   Lutricia Feiloemer, Sravya Grissom P, PA-C    Family History Family History  Problem Relation Age of Onset  . Cancer Mother   . Diabetes Father   . Hypertension Father   . Cancer Maternal Grandfather   . Cancer Paternal Grandmother     Social History Social History   Tobacco Use  . Smoking status: Current Every Day Smoker    Packs/day: 0.50  . Smokeless tobacco: Never Used  Substance Use Topics  . Alcohol use: No    Alcohol/week: 0.0 oz  . Drug use: No     Allergies   Amoxil [amoxicillin]; Codeine; and Vicodin [hydrocodone-acetaminophen]   Review of Systems Review of Systems  Constitutional: Positive for activity change and chills. Negative for fatigue and fever.  HENT: Positive for congestion, postnasal drip, rhinorrhea, sinus pressure and sinus pain.   Respiratory: Positive  for cough. Negative for shortness of breath.   All other systems reviewed and are negative.    Physical Exam Triage Vital Signs ED Triage Vitals  Enc Vitals Group     BP 01/15/17 0832 94/76     Pulse Rate 01/15/17 0832 83     Resp 01/15/17 0832 18     Temp 01/15/17 0832 98.6 F (37 C)     Temp Source 01/15/17 0832 Oral     SpO2 01/15/17 0832 100 %     Weight 01/15/17 0831 223 lb (101.2 kg)     Height 01/15/17 0831 5' 3.5" (1.613 m)     Head Circumference --      Peak Flow --      Pain Score 01/15/17 0831 6     Pain Loc --      Pain Edu? --      Excl. in GC? --    No data found.  Updated Vital Signs BP 94/76 (BP Location: Left Arm)   Pulse 83   Temp 98.6 F (37 C) (Oral)   Resp 18   Ht 5' 3.5" (1.613 m)   Wt 223 lb (101.2 kg)   LMP 01/14/2017   SpO2 100%   BMI 38.88 kg/m   Visual Acuity Right Eye Distance:   Left Eye Distance:   Bilateral Distance:  Right Eye Near:   Left Eye Near:    Bilateral Near:     Physical Exam  Constitutional: She is oriented to person, place, and time. She appears well-developed and well-nourished. No distress.  HENT:  Head: Normocephalic.  Right Ear: External ear normal.  Left Ear: External ear normal.  Nose: Nose normal.  Mouth/Throat: Oropharynx is clear and moist. No oropharyngeal exudate.  Eyes: Pupils are equal, round, and reactive to light. Right eye exhibits no discharge. Left eye exhibits no discharge.  Neck: Normal range of motion.  Pulmonary/Chest: Effort normal and breath sounds normal.  Musculoskeletal: Normal range of motion.  Lymphadenopathy:    She has no cervical adenopathy.  Neurological: She is alert and oriented to person, place, and time.  Skin: Skin is warm and dry. She is not diaphoretic.  Psychiatric: She has a normal mood and affect. Her behavior is normal. Judgment and thought content normal.  Nursing note and vitals reviewed.    UC Treatments / Results  Labs (all labs ordered are listed, but  only abnormal results are displayed) Labs Reviewed - No data to display  EKG  EKG Interpretation None       Radiology No results found.  Procedures Procedures (including critical care time)  Medications Ordered in UC Medications - No data to display   Initial Impression / Assessment and Plan / UC Course  I have reviewed the triage vital signs and the nursing notes.  Pertinent labs & imaging results that were available during my care of the patient were reviewed by me and considered in my medical decision making (see chart for details).     Plan: 1. Test/x-ray results and diagnosis reviewed with patient 2. rx as per orders; risks, benefits, potential side effects reviewed with patient 3. Recommend supportive treatment with fluids. Tylenol or Motrin for aches and pains as well as fever. Recommend following up with a primary care physician if she is not improving in 7-10 days. 4. F/u prn if symptoms worsen or don't improve   Final Clinical Impressions(s) / UC Diagnoses   Final diagnoses:  Upper respiratory tract infection, unspecified type    ED Discharge Orders        Ordered    chlorpheniramine-HYDROcodone (TUSSIONEX PENNKINETIC ER) 10-8 MG/5ML SUER  2 times daily     01/15/17 0916       Controlled Substance Prescriptions [place] Controlled Substance Registry consulted? Not Applicable   Lutricia FeilRoemer, Alfard Cochrane P, PA-C 01/15/17 [id number]

## 2017-01-15 NOTE — ED Triage Notes (Signed)
Patient complains of body aches, chills, cough-dry x 2 days ago. Patient states that symptoms worsened yesterday.

## 2017-04-08 ENCOUNTER — Encounter: Payer: Self-pay | Admitting: *Deleted

## 2017-04-08 ENCOUNTER — Ambulatory Visit
Admission: EM | Admit: 2017-04-08 | Discharge: 2017-04-08 | Disposition: A | Discharge status: Home / Self Care | Payer: Self-pay | Attending: Family Medicine | Admitting: Family Medicine

## 2017-04-08 DIAGNOSIS — J111 Influenza due to unidentified influenza virus with other respiratory manifestations: Secondary | ICD-10-CM

## 2017-04-08 DIAGNOSIS — R69 Illness, unspecified: Secondary | ICD-10-CM

## 2017-04-08 DIAGNOSIS — R05 Cough: Secondary | ICD-10-CM

## 2017-04-08 DIAGNOSIS — R509 Fever, unspecified: Secondary | ICD-10-CM

## 2017-04-08 DIAGNOSIS — J029 Acute pharyngitis, unspecified: Secondary | ICD-10-CM

## 2017-04-08 DIAGNOSIS — M791 Myalgia, unspecified site: Secondary | ICD-10-CM

## 2017-04-08 DIAGNOSIS — R51 Headache: Secondary | ICD-10-CM

## 2017-04-08 LAB — RAPID STREP SCREEN (MED CTR MEBANE ONLY): STREPTOCOCCUS, GROUP A SCREEN (DIRECT): NEGATIVE

## 2017-04-08 MED ORDER — OSELTAMIVIR PHOSPHATE 75 MG PO CAPS: 75.0000 mg | ORAL_CAPSULE | Freq: Two times a day (BID) | ORAL | 0 refills | Status: DC

## 2017-04-08 MED ORDER — GUAIFENESIN-CODEINE 100-10 MG/5ML PO SOLN: ORAL | 0 refills | Status: DC

## 2017-04-08 NOTE — ED Triage Notes (Signed)
Saturday onset of cough, fever, sore throat, body aches, and weakness.

## 2017-04-08 NOTE — ED Provider Notes (Signed)
[facility]    CSN: [id number] Arrival date & time: 04/08/17  1008     History   Chief Complaint Chief Complaint  Patient presents with  . Fever  . Cough  . Headache  . Generalized Body Aches    HPI Angela Lester is a 32 y.o. female.   The history is provided by the patient.  URI  Presenting symptoms: congestion, cough, fatigue, fever, rhinorrhea and sore throat   Severity:  Moderate Onset quality:  Sudden Duration:  3 days Timing:  Constant Progression:  Worsening Chronicity:  New Relieved by:  Nothing Ineffective treatments:  OTC medications Associated symptoms: headaches and myalgias   Associated symptoms: no wheezing   Risk factors: sick contacts (exposed to flu at work )   Risk factors: not elderly, no chronic cardiac disease, no chronic kidney disease, no chronic respiratory disease, no diabetes mellitus, no immunosuppression, no recent illness and no recent travel     Past Medical History:  Diagnosis Date  . Anxiety   . Post traumatic stress disorder (PTSD)     There are no active problems to display for this patient.   Past Surgical History:  Procedure Laterality Date  . BREAST ENHANCEMENT SURGERY    . TONSILLECTOMY      OB History    No data available       Home Medications    Prior to Admission medications   Medication Sig Start Date End Date Taking? Authorizing Provider  chlorpheniramine-HYDROcodone [facility] Meuse([facility]) 10-8 MG/5ML SUER Take 5 mLs 2 (two) times daily by mouth. 01/15/17   Lutricia Feiloemer, William P, PA-C  guaiFENesin-codeine 100-10 MG/5ML syrup 5 ml po qd prn 04/08/17   Payton Mccallumonty, Lorretta Kerce, MD  oseltamivir (TAMIFLU) 75 MG capsule Take 1 capsule (75 mg total) by mouth 2 (two) times daily. 04/08/17   Payton Mccallumonty, Kennadie Brenner, MD    Family History Family History  Problem Relation Age of Onset  . Cancer Mother   . Pneumonia Mother   . Diabetes Father   . Hypertension Father   . Dementia Father   . Cancer Maternal Grandfather    . Cancer Paternal Grandmother     Social History Social History   Tobacco Use  . Smoking status: Current Every Day Smoker    Packs/day: 0.50  . Smokeless tobacco: Never Used  Substance Use Topics  . Alcohol use: No    Alcohol/week: 0.0 oz  . Drug use: No     Allergies   Amoxil [amoxicillin]; Codeine; and Vicodin [hydrocodone-acetaminophen]   Review of Systems Review of Systems  Constitutional: Positive for fatigue and fever.  HENT: Positive for congestion, rhinorrhea and sore throat.   Respiratory: Positive for cough. Negative for wheezing.   Musculoskeletal: Positive for myalgias.  Neurological: Positive for headaches.     Physical Exam Triage Vital Signs ED Triage Vitals  Enc Vitals Group     BP 04/08/17 1041 112/85     Pulse Rate 04/08/17 1041 86     Resp 04/08/17 1041 20     Temp 04/08/17 1041 98.2 F (36.8 C)     Temp Source 04/08/17 1041 Oral     SpO2 04/08/17 1041 98 %     Weight 04/08/17 1044 220 lb (99.8 kg)     Height 04/08/17 1044 5\' 4"  (1.626 m)     Head Circumference --      Peak Flow --      Pain Score --      Pain Loc --  Pain Edu? --      Excl. in GC? --    No data found.  Updated Vital Signs BP 112/85 (BP Location: Left Arm)   Pulse 86   Temp 98.2 F (36.8 C) (Oral)   Resp 20   Ht 5\' 4"  (1.626 m)   Wt 220 lb (99.8 kg)   SpO2 98%   BMI 37.76 kg/m   Visual Acuity Right Eye Distance:   Left Eye Distance:   Bilateral Distance:    Right Eye Near:   Left Eye Near:    Bilateral Near:     Physical Exam  Constitutional: She is oriented to person, place, and time. She appears well-developed and well-nourished. No distress.  HENT:  Head: Normocephalic and atraumatic.  Right Ear: Tympanic membrane, external ear and ear canal normal.  Left Ear: Tympanic membrane, external ear and ear canal normal.  Nose: Mucosal edema and rhinorrhea present. No nose lacerations, sinus tenderness, nasal deformity, septal deviation or nasal  septal hematoma. No epistaxis.  No foreign bodies. Right sinus exhibits no maxillary sinus tenderness and no frontal sinus tenderness. Left sinus exhibits no maxillary sinus tenderness and no frontal sinus tenderness.  Mouth/Throat: Uvula is midline, oropharynx is clear and moist and mucous membranes are normal. No oropharyngeal exudate.  Eyes: Conjunctivae and EOM are normal. Pupils are equal, round, and reactive to light. Right eye exhibits no discharge. Left eye exhibits no discharge. No scleral icterus.  Neck: Normal range of motion. Neck supple. No thyromegaly present.  Cardiovascular: Normal rate, regular rhythm and normal heart sounds.  Pulmonary/Chest: Effort normal and breath sounds normal. No stridor. No respiratory distress. She has no wheezes. She has no rales.  Lymphadenopathy:    She has no cervical adenopathy.  Neurological: She is alert and oriented to person, place, and time. No cranial nerve deficit or sensory deficit. She exhibits normal muscle tone. Coordination normal.  Skin: She is not diaphoretic.  Nursing note and vitals reviewed.    UC Treatments / Results  Labs (all labs ordered are listed, but only abnormal results are displayed) Labs Reviewed  RAPID STREP SCREEN (NOT AT [facility]  CULTURE, GROUP A STREP [facility])    EKG  EKG Interpretation None       Radiology No results found.  Procedures Procedures (including critical care time)  Medications Ordered in UC Medications - No data to display   Initial Impression / Assessment and Plan / UC Course  I have reviewed the triage vital signs and the nursing notes.  Pertinent labs & imaging results that were available during my care of the patient were reviewed by me and considered in my medical decision making (see chart for details).       Final Clinical Impressions(s) / UC Diagnoses   Final diagnoses:  Influenza-like illness    ED Discharge Orders        Ordered    oseltamivir (TAMIFLU) 75 MG  capsule  2 times daily     04/08/17 1131    guaiFENesin-codeine 100-10 MG/5ML syrup     04/08/17 1137     1. Lab results and diagnosis reviewed with patient 2. rx as per orders above; reviewed possible side effects, interactions, risks and benefits  3. Recommend supportive treatment with rest, fluids, otc analgesics prn 4. Follow-up prn if symptoms worsen or don't improve  Controlled Substance Prescriptions [place] Controlled Substance Registry consulted? Not Applicable   Payton Mccallum, MD 04/08/17 1141

## 2017-04-08 NOTE — ED Triage Notes (Signed)
Also, co-worker pt spent time with recently dx with flu.

## 2017-04-11 LAB — CULTURE, GROUP A STREP (THRC)

## 2017-09-24 ENCOUNTER — Encounter: Payer: Self-pay | Admitting: Emergency Medicine

## 2017-09-24 ENCOUNTER — Ambulatory Visit
Admission: EM | Admit: 2017-09-24 | Discharge: 2017-09-24 | Disposition: A | Discharge status: Home / Self Care | Payer: Self-pay | Attending: Family Medicine | Admitting: Family Medicine

## 2017-09-24 ENCOUNTER — Other Ambulatory Visit: Payer: Self-pay

## 2017-09-24 DIAGNOSIS — J069 Acute upper respiratory infection, unspecified: Secondary | ICD-10-CM

## 2017-09-24 MED ORDER — HYDROCOD POLST-CPM POLST ER 10-8 MG/5ML PO SUER: 5.0000 mL | Freq: Two times a day (BID) | ORAL | 0 refills | Status: DC

## 2017-09-24 MED ORDER — AZITHROMYCIN 250 MG PO TABS: 250.0000 mg | ORAL_TABLET | Freq: Every day | ORAL | 0 refills | Status: DC

## 2017-09-24 MED ORDER — BENZONATATE 200 MG PO CAPS: ORAL_CAPSULE | ORAL | 0 refills | Status: DC

## 2017-09-24 NOTE — ED Triage Notes (Signed)
Pt here today c/o fever on and off for the last week. Cough, SOB,malaise, nasal congestion, green/yellow sputum, bilateral ear pain. Started 4 days ago. Fever has been as high as 101. She has been taking Claritin, Tylenol cold and Tylenol.

## 2017-09-24 NOTE — ED Provider Notes (Signed)
[facility]    CSN: [id number] Arrival date & time: 09/24/17  1013     History   Chief Complaint Chief Complaint  Patient presents with  . URI    HPI Angela Lester is a 32 y.o. female.   HPI  32 year old female presents with a fever that she has had on and off for the last week.  Been as high as 101 usually at nighttime.  This is along with a cough that is productive of green-yellow sputum days nasal congestion and bilateral ear pain.  States the other symptoms started about 4 days ago and she feels miserable.         Past Medical History:  Diagnosis Date  . Anxiety   . Post traumatic stress disorder (PTSD)     There are no active problems to display for this patient.   Past Surgical History:  Procedure Laterality Date  . BREAST ENHANCEMENT SURGERY    . TONSILLECTOMY      OB History   None      Home Medications    Prior to Admission medications   Medication Sig Start Date End Date Taking? Authorizing Provider  Loratadine 10 MG CAPS Take by mouth.   Yes [provider]  azithromycin (ZITHROMAX) 250 MG tablet Take 1 tablet (250 mg total) by mouth daily. Take first 2 tablets together, then 1 every day until finished. 09/24/17   Lutricia Feil, PA-C  benzonatate (TESSALON) 200 MG capsule Take one cap TID PRN cough 09/24/17   Lutricia Feil, PA-C  chlorpheniramine-HYDROcodone [facility]) 10-8 MG/5ML SUER Take 5 mLs by mouth 2 (two) times daily. 09/24/17   Lutricia Feil, PA-C    Family History Family History  Problem Relation Age of Onset  . Cancer Mother   . Pneumonia Mother   . Diabetes Father   . Hypertension Father   . Dementia Father   . Cancer Maternal Grandfather   . Cancer Paternal Grandmother     Social History Social History   Tobacco Use  . Smoking status: Current Every Day Smoker    Packs/day: 0.50  . Smokeless tobacco: Never Used  Substance Use Topics  . Alcohol use: No   Alcohol/week: 0.0 oz  . Drug use: No     Allergies   Amoxil [amoxicillin]; Codeine; and Vicodin [hydrocodone-acetaminophen]   Review of Systems Review of Systems  Constitutional: Positive for activity change, chills, fatigue and fever.  HENT: Positive for congestion, ear pain, postnasal drip, rhinorrhea, sinus pressure and sinus pain.   Respiratory: Positive for cough and shortness of breath.   All other systems reviewed and are negative.    Physical Exam Triage Vital Signs ED Triage Vitals  Enc Vitals Group     BP 09/24/17 1046 122/85     Pulse Rate 09/24/17 1046 79     Resp 09/24/17 1046 18     Temp 09/24/17 1046 98 F (36.7 C)     Temp Source 09/24/17 1046 Oral     SpO2 09/24/17 1046 98 %     Weight 09/24/17 1045 220 lb (99.8 kg)     Height 09/24/17 1045 5\' 3"  (1.6 m)     Head Circumference --      Peak Flow --      Pain Score 09/24/17 1046 8     Pain Loc --      Pain Edu? --      Excl. in GC? --    No  data found.  Updated Vital Signs BP 122/85 (BP Location: Left Arm)   Pulse 79   Temp 98 F (36.7 C) (Oral)   Resp 18   Ht 5\' 3"  (1.6 m)   Wt 220 lb (99.8 kg)   LMP 08/20/2017   SpO2 98%   BMI 38.97 kg/m   Visual Acuity Right Eye Distance:   Left Eye Distance:   Bilateral Distance:    Right Eye Near:   Left Eye Near:    Bilateral Near:     Physical Exam  Constitutional: She appears well-developed and well-nourished. No distress.  HENT:  Head: Normocephalic.  Right Ear: External ear normal.  Left Ear: External ear normal.  Nose: Nose normal.  Mouth/Throat: Oropharynx is clear and moist. No oropharyngeal exudate.  Eyes: Pupils are equal, round, and reactive to light. Right eye exhibits no discharge. Left eye exhibits no discharge.  Neck: Normal range of motion.  Pulmonary/Chest: Effort normal and breath sounds normal.  Lymphadenopathy:    She has no cervical adenopathy.  Skin: She is not diaphoretic.  Nursing note and vitals  reviewed.    UC Treatments / Results  Labs (all labs ordered are listed, but only abnormal results are displayed) Labs Reviewed - No data to display  EKG None  Radiology No results found.  Procedures Procedures (including critical care time)  Medications Ordered in UC Medications - No data to display  Initial Impression / Assessment and Plan / UC Course  I have reviewed the triage vital signs and the nursing notes.  Pertinent labs & imaging results that were available during my care of the patient were reviewed by me and considered in my medical decision making (see chart for details).     Plan: 1. Test/x-ray results and diagnosis reviewed with patient 2. rx as per orders; risks, benefits, potential side effects reviewed with patient 3. Recommend supportive treatment with rest and fluids.  Tylenol or Motrin as necessary for body aches and fever.  States that she has had no problem taking Tussionex in the past if she pre-loads with Benadryl.  So we will provide her with Jerilynn Somessalon Perles for daytime use.  Because of the mild discomfort that she has in the amount of time that she is at the fever over a week I will prescribe azithromycin.  Not improving she should follow-up with her primary care physician or may return to our clinic 4. F/u prn if symptoms worsen or don't improve  Final Clinical Impressions(s) / UC Diagnoses   Final diagnoses:  Upper respiratory tract infection, unspecified type     Discharge Instructions     Get plenty of rest.  Hydrate well.    ED Prescriptions    Medication Sig Dispense Auth. Provider   chlorpheniramine-HYDROcodone (TUSSIONEX PENNKINETIC ER) 10-8 MG/5ML SUER  (Status: Discontinued) Take 5 mLs by mouth 2 (two) times daily. 115 mL Ovid Curdoemer, Gioia Ranes P, PA-C   benzonatate (TESSALON) 200 MG capsule  (Status: Discontinued) Take one cap TID PRN cough 30 capsule Ovid Curdoemer, Annalisa Colonna P, PA-C   azithromycin (ZITHROMAX) 250 MG tablet  (Status:  Discontinued) Take 1 tablet (250 mg total) by mouth daily. Take first 2 tablets together, then 1 every day until finished. 6 tablet Ovid Curdoemer, Benz Vandenberghe P, PA-C   azithromycin (ZITHROMAX) 250 MG tablet Take 1 tablet (250 mg total) by mouth daily. Take first 2 tablets together, then 1 every day until finished. 6 tablet Ovid Curdoemer, Jionni Helming P, PA-C   benzonatate (TESSALON) 200 MG capsule Take one cap  TID PRN cough 30 capsule Lutricia Feil, PA-C   chlorpheniramine-HYDROcodone (TUSSIONEX PENNKINETIC ER) 10-8 MG/5ML SUER Take 5 mLs by mouth 2 (two) times daily. 115 mL Lutricia Feil, PA-C     Controlled Substance Prescriptions [place] Controlled Substance Registry consulted? Not Applicable   Lutricia Feil, PA-C 09/24/17 1204

## 2017-09-24 NOTE — Discharge Instructions (Addendum)
Get plenty of rest.  Hydrate well.

## 2017-11-25 ENCOUNTER — Emergency Department: Payer: Self-pay

## 2017-11-25 ENCOUNTER — Other Ambulatory Visit: Payer: Self-pay

## 2017-11-25 DIAGNOSIS — S300XXA Contusion of lower back and pelvis, initial encounter: Secondary | ICD-10-CM | POA: Insufficient documentation

## 2017-11-25 DIAGNOSIS — W1789XA Other fall from one level to another, initial encounter: Secondary | ICD-10-CM | POA: Insufficient documentation

## 2017-11-25 DIAGNOSIS — Y999 Unspecified external cause status: Secondary | ICD-10-CM | POA: Insufficient documentation

## 2017-11-25 DIAGNOSIS — Y9389 Activity, other specified: Secondary | ICD-10-CM | POA: Insufficient documentation

## 2017-11-25 DIAGNOSIS — Y929 Unspecified place or not applicable: Secondary | ICD-10-CM | POA: Insufficient documentation

## 2017-11-25 DIAGNOSIS — Z79899 Other long term (current) drug therapy: Secondary | ICD-10-CM | POA: Insufficient documentation

## 2017-11-25 DIAGNOSIS — F17210 Nicotine dependence, cigarettes, uncomplicated: Secondary | ICD-10-CM | POA: Insufficient documentation

## 2017-11-25 NOTE — ED Triage Notes (Signed)
PT fell off a swing falling on to buttocks and is now having sacral pain.

## 2017-11-26 ENCOUNTER — Emergency Department
Admission: EM | Admit: 2017-11-26 | Discharge: 2017-11-26 | Disposition: A | Discharge status: Home / Self Care | Payer: Self-pay | Attending: Emergency Medicine | Admitting: Emergency Medicine

## 2017-11-26 DIAGNOSIS — S300XXA Contusion of lower back and pelvis, initial encounter: Secondary | ICD-10-CM

## 2017-11-26 MED ORDER — HYDROCODONE-ACETAMINOPHEN 5-325 MG PO TABS: 1.0000 | ORAL_TABLET | ORAL | 0 refills | Status: DC | PRN

## 2017-11-26 MED ORDER — HYDROCODONE-ACETAMINOPHEN 5-325 MG PO TABS
2.0000 | ORAL_TABLET | Freq: Once | ORAL | Status: AC
  Administered 2017-11-26: 2 via ORAL
  Filled 2017-11-26: qty 2

## 2017-11-26 MED ORDER — DOCUSATE SODIUM 100 MG PO CAPS: ORAL_CAPSULE | ORAL | 0 refills | Status: DC

## 2017-11-26 MED ORDER — DIPHENHYDRAMINE HCL 25 MG PO CAPS
25.0000 mg | ORAL_CAPSULE | Freq: Once | ORAL | Status: AC
  Administered 2017-11-26: 25 mg via ORAL
  Filled 2017-11-26: qty 1

## 2017-11-26 NOTE — ED Notes (Signed)
Pt states that around 5:30pm yesterday she was in her chair hammock holding a baby when it gave out and she landed on her tail bone on her front porch.

## 2017-11-26 NOTE — ED Provider Notes (Signed)
[facility] Emergency Department Provider Note  ____________________________________________   First MD Initiated Contact with Patient 11/26/17 [phone]     (approximate)  I have reviewed the triage vital signs and the nursing notes.   HISTORY  Chief Complaint Tailbone Pain    HPI Angela Lester is a 32 y.o. female with medical history as listed below who presents for evaluation of acute onset severe pain to her tailbone after falling off of a swing.  She reports that she was sitting on a swing on a porch over concrete and a gave out and she landed directly on her tailbone.  The pain is been severe and worse with any amount of movement and tender to the touch.  Over-the-counter pain medication does not seem to be helping.  She is in tears in the emergency department.  She did not strike her head and has no neck pain.  She denies chest pain, shortness of breath, nausea, vomiting, and abdominal pain.  Past Medical History:  Diagnosis Date  . Anxiety   . Post traumatic stress disorder (PTSD)     There are no active problems to display for this patient.   Past Surgical History:  Procedure Laterality Date  . BREAST ENHANCEMENT SURGERY    . TONSILLECTOMY      Prior to Admission medications   Medication Sig Start Date End Date Taking? Authorizing Provider  azithromycin (ZITHROMAX) 250 MG tablet Take 1 tablet (250 mg total) by mouth daily. Take first 2 tablets together, then 1 every day until finished. 09/24/17   Lutricia Feil, PA-C  benzonatate (TESSALON) 200 MG capsule Take one cap TID PRN cough 09/24/17   Lutricia Feil, PA-C  chlorpheniramine-HYDROcodone [facility] ER) 10-8 MG/5ML SUER Take 5 mLs by mouth 2 (two) times daily. 09/24/17   Lutricia Feil, PA-C  docusate sodium (COLACE) 100 MG capsule Take 1 tablet once or twice daily as needed for constipation while taking narcotic pain medicine 11/26/17   Loleta Rose, MD    HYDROcodone-acetaminophen (NORCO/VICODIN) 5-325 MG tablet Take 1-2 tablets by mouth every 4 (four) hours as needed for moderate pain. 11/26/17   Loleta Rose, MD  Loratadine 10 MG CAPS Take by mouth.    [provider]    Allergies Amoxil [amoxicillin]; Codeine; and Vicodin [hydrocodone-acetaminophen]  Family History  Problem Relation Age of Onset  . Cancer Mother   . Pneumonia Mother   . Diabetes Father   . Hypertension Father   . Dementia Father   . Cancer Maternal Grandfather   . Cancer Paternal Grandmother     Social History Social History   Tobacco Use  . Smoking status: Current Every Day Smoker    Packs/day: 0.50  . Smokeless tobacco: Never Used  Substance Use Topics  . Alcohol use: No    Alcohol/week: 0.0 standard drinks  . Drug use: No    Review of Systems Constitutional: No fever/chills Cardiovascular: Denies chest pain. Respiratory: Denies shortness of breath. Gastrointestinal: No abdominal pain.  No nausea, no vomiting.  No diarrhea.  No constipation. Musculoskeletal: Pain in the tailbone after a fall as described above.  Negative for neck pain.  No back pain above the tailbone. Integumentary: Negative for rash. Neurological: Negative for headaches, focal weakness or numbness.   ____________________________________________   PHYSICAL EXAM:  VITAL SIGNS: ED Triage Vitals  Enc Vitals Group     BP 11/25/17 2248 116/86     Pulse Rate 11/25/17 2248 76  Resp 11/25/17 2248 20     Temp 11/25/17 2248 98.9 F (37.2 C)     Temp Source 11/25/17 2248 Oral     SpO2 11/25/17 2248 98 %     Weight 11/25/17 2248 102.1 kg (225 lb)     Height 11/25/17 2248 1.6 m (5\' 3" )     Head Circumference --      Peak Flow --      Pain Score 11/25/17 2256 10     Pain Loc --      Pain Edu? --      Excl. in GC? --     Constitutional: Alert and oriented.  Appears very uncomfortable and is tearful from the pain Eyes: Conjunctivae are normal.  Head:  Atraumatic. Nose: No congestion/rhinnorhea. Cardiovascular: Normal rate, regular rhythm. Good peripheral circulation. Respiratory: Normal respiratory effort.  No retractions. Lungs CTAB. Gastrointestinal: Soft and nontender. No distention. Musculoskeletal: There is no obvious bruising or swelling.  She has severe tenderness to palpation of the tailbone.  There is no tenderness to palpation along the lumbar spine. Neurologic:  Normal speech and language. No gross focal neurologic deficits are appreciated.  Skin:  Skin is warm, dry and intact. No rash noted. Psychiatric: Mood and affect are normal. Speech and behavior are normal.  ____________________________________________   LABS (all labs ordered are listed, but only abnormal results are displayed)  Labs Reviewed - No data to display ____________________________________________  EKG  None - EKG not ordered by ED physician ____________________________________________  RADIOLOGY   ED MD interpretation: No acute fracture  Official radiology report(s): Dg Sacrum/coccyx  Result Date: 11/25/2017 CLINICAL DATA:  Fall with tailbone pain EXAM: SACRUM AND COCCYX - 2+ VIEW COMPARISON:  10/13/2010 CT abdomen/pelvis FINDINGS: There is no evidence of fracture or other focal bone lesions. IMPRESSION: Negative. Electronically Signed   By: Delbert PhenixJason A Poff M.D.   On: 11/25/2017 23:49    ____________________________________________   PROCEDURES  Critical Care performed: No   Procedure(s) performed:   Procedures   ____________________________________________   INITIAL IMPRESSION / ASSESSMENT AND PLAN / ED COURSE  As part of my medical decision making, I reviewed the following data within the electronic MEDICAL RECORD NUMBER Nursing notes reviewed and incorporated, Radiograph reviewed  and Notes from prior ED visits, checked [place] controlled substance database    No indication of acute fracture on radiographs.  No tenderness to palpation  along the lumbar spine.  The pain is very localized and consistent with coccyx contusion.  I had my usual customary discussion with the patient in terms of pain management, treatment recommendations, and duration of pain (at least 4 to 6 weeks).  She has no findings on the [place] controlled substance database and I will give her a short course of Norco which she says she has taken before without any side effects in spite of a listed allergy (she typically takes a Benadryl before taking them pain medication).  I encouraged her to use over-the-counter pain medication and a stool softener as much as possible.     ____________________________________________  FINAL CLINICAL IMPRESSION(S) / ED DIAGNOSES  Final diagnoses:  Contusion of coccyx, initial encounter     MEDICATIONS GIVEN DURING THIS VISIT:  Medications  diphenhydrAMINE (BENADRYL) capsule 25 mg (25 mg Oral Given 11/26/17 0238)  HYDROcodone-acetaminophen (NORCO/VICODIN) 5-325 MG per tablet 2 tablet (2 tablets Oral Given 11/26/17 0238)     ED Discharge Orders         Ordered    HYDROcodone-acetaminophen (NORCO/VICODIN)  5-325 MG tablet  Every 4 hours PRN     11/26/17 0239    docusate sodium (COLACE) 100 MG capsule     11/26/17 0239           Note:  This document was prepared using Dragon voice recognition software and may include unintentional dictation errors.    Loleta Rose, MD 11/26/17 [phone]

## 2018-04-28 ENCOUNTER — Other Ambulatory Visit: Payer: Self-pay

## 2018-04-28 ENCOUNTER — Encounter: Payer: Self-pay | Admitting: Emergency Medicine

## 2018-04-28 ENCOUNTER — Emergency Department
Admission: EM | Admit: 2018-04-28 | Discharge: 2018-04-28 | Disposition: A | Discharge status: Home / Self Care | Payer: Self-pay | Attending: Emergency Medicine | Admitting: Emergency Medicine

## 2018-04-28 DIAGNOSIS — F17210 Nicotine dependence, cigarettes, uncomplicated: Secondary | ICD-10-CM | POA: Insufficient documentation

## 2018-04-28 DIAGNOSIS — L237 Allergic contact dermatitis due to plants, except food: Secondary | ICD-10-CM | POA: Insufficient documentation

## 2018-04-28 MED ORDER — TRAMADOL HCL 50 MG PO TABS: 50.0000 mg | ORAL_TABLET | Freq: Four times a day (QID) | ORAL | 0 refills | Status: AC | PRN

## 2018-04-28 MED ORDER — DEXAMETHASONE SODIUM PHOSPHATE 10 MG/ML IJ SOLN
10.0000 mg | Freq: Once | INTRAMUSCULAR | Status: DC
  Filled 2018-04-28: qty 1

## 2018-04-28 MED ORDER — DEXAMETHASONE SODIUM PHOSPHATE 10 MG/ML IJ SOLN
10.0000 mg | Freq: Once | INTRAMUSCULAR | Status: AC
  Administered 2018-04-28: 10 mg via INTRAMUSCULAR

## 2018-04-28 MED ORDER — PREDNISONE 10 MG (21) PO TBPK: ORAL_TABLET | ORAL | 0 refills | Status: AC

## 2018-04-28 NOTE — Discharge Instructions (Addendum)
Follow-up with your regular doctor if not better in 3 to 5 days.  Return emergency department worsening.  Take the medications as prescribed.

## 2018-04-28 NOTE — ED Provider Notes (Signed)
[facility] Emergency Department Provider Note  ____________________________________________   First MD Initiated Contact with Patient 04/28/18 1330     (approximate)  I have reviewed the triage vital signs and the nursing notes.   HISTORY  Chief Complaint Rash    HPI Angela Lester is a 33 y.o. female patient states she has a rash everywhere.  She states that the dogs go outside in her tree that has poison ivy.  She states the areas have been linear, itching and burning, and some drainage.  She put calamine lotion on.  He has not had a lot of improvement.  No fever or chills.    Past Medical History:  Diagnosis Date  . Anxiety   . Post traumatic stress disorder (PTSD)     There are no active problems to display for this patient.   Past Surgical History:  Procedure Laterality Date  . BREAST ENHANCEMENT SURGERY    . TONSILLECTOMY      Prior to Admission medications   Medication Sig Start Date End Date Taking? Authorizing Provider  Loratadine 10 MG CAPS Take by mouth.    [provider]  predniSONE (STERAPRED UNI-PAK 21 TAB) 10 MG (21) TBPK tablet Take 6 pills on day one then decrease by 1 pill each day 04/28/18   Faythe Ghee, PA-C  traMADol (ULTRAM) 50 MG tablet Take 1 tablet (50 mg total) by mouth every 6 (six) hours as needed. 04/28/18   Faythe Ghee, PA-C    Allergies Amoxil [amoxicillin]; Codeine; and Vicodin [hydrocodone-acetaminophen]  Family History  Problem Relation Age of Onset  . Cancer Mother   . Pneumonia Mother   . Diabetes Father   . Hypertension Father   . Dementia Father   . Cancer Maternal Grandfather   . Cancer Paternal Grandmother     Social History Social History   Tobacco Use  . Smoking status: Current Every Day Smoker    Packs/day: 0.50  . Smokeless tobacco: Never Used  Substance Use Topics  . Alcohol use: No    Alcohol/week: 0.0 standard drinks  . Drug use: No    Review of  Systems  Constitutional: No fever/chills Eyes: No visual changes. ENT: No sore throat. Respiratory: Denies cough Genitourinary: Negative for dysuria. Musculoskeletal: Negative for back pain. Skin: Positive for rash.    ____________________________________________   PHYSICAL EXAM:  VITAL SIGNS: ED Triage Vitals  Enc Vitals Group     BP 04/28/18 1231 119/87     Pulse Rate 04/28/18 1231 72     Resp 04/28/18 1231 18     Temp 04/28/18 1231 98.1 F (36.7 C)     Temp Source 04/28/18 1231 Oral     SpO2 04/28/18 1231 99 %     Weight 04/28/18 1228 232 lb (105.2 kg)     Height 04/28/18 1228 5\' 3"  (1.6 m)     Head Circumference --      Peak Flow --      Pain Score 04/28/18 1227 8     Pain Loc --      Pain Edu? --      Excl. in GC? --     Constitutional: Alert and oriented. Well appearing and in no acute distress. Eyes: Conjunctivae are normal.  Head: Atraumatic. Nose: No congestion/rhinnorhea. Mouth/Throat: Mucous membranes are moist.   Neck:  supple no lymphadenopathy noted Cardiovascular: Normal rate, regular rhythm. Heart sounds are normal Respiratory: Normal respiratory effort.  No retractions, lungs c t  a  GU: deferred Musculoskeletal: FROM all extremities, warm and well perfused Neurologic:  Normal speech and language.  Skin:  Skin is warm, dry and intact.  Positive for linear areas that are inflamed with a small vesicle-like lesions on them.  Very typical of poison ivy.Marland Kitchen Psychiatric: Mood and affect are normal. Speech and behavior are normal.  ____________________________________________   LABS (all labs ordered are listed, but only abnormal results are displayed)  Labs Reviewed - No data to display ____________________________________________   ____________________________________________  RADIOLOGY    ____________________________________________   PROCEDURES  Procedure(s) performed: Decadron 10 mg  IM  Procedures    ____________________________________________   INITIAL IMPRESSION / ASSESSMENT AND PLAN / ED COURSE  Pertinent labs & imaging results that were available during my care of the patient were reviewed by me and considered in my medical decision making (see chart for details).   Patient is 33 year old female presents emergency department with concerns of poison ivy.  Physical exam shows a rash that is typical poison ivy.  Explained to the patient she has poison ivy.  She was given a prescription for Sterapred.  She is given a injection of Decadron 10 mg IM while here in the ED.  She is to follow-up with her regular doctor.  States she understands will comply.  She is discharged stable condition.  She was complaining of a lot of pain with the rash.  She was given prescription of tramadol.     As part of my medical decision making, I reviewed the following data within the electronic MEDICAL RECORD NUMBER History obtained from family, Nursing notes reviewed and incorporated, Old chart reviewed, Notes from prior ED visits and [place] Controlled Substance Database  ____________________________________________   FINAL CLINICAL IMPRESSION(S) / ED DIAGNOSES  Final diagnoses:  Poison ivy      NEW MEDICATIONS STARTED DURING THIS VISIT:  Discharge Medication List as of 04/28/2018  1:51 PM    START taking these medications   Details  predniSONE (STERAPRED UNI-PAK 21 TAB) 10 MG (21) TBPK tablet Take 6 pills on day one then decrease by 1 pill each day, Normal    traMADol (ULTRAM) 50 MG tablet Take 1 tablet (50 mg total) by mouth every 6 (six) hours as needed., Starting Mon 04/28/2018, Normal         Note:  This document was prepared using Dragon voice recognition software and may include unintentional dictation errors.    Faythe Ghee, PA-C 04/28/18 1539    Rockne Menghini, MD 04/28/18 [phone]

## 2018-04-28 NOTE — ED Triage Notes (Signed)
Pt states raised rash "everywhere" since Saturday, hasn't started anything new or switched anything out, no new foods, states possible poison ivy. NAD. Benadryl helps with itching.
